# Patient Record
Sex: Male | Born: 1975 | Race: White | Hispanic: Yes | Marital: Married | Smoking: Never smoker
Health system: Southern US, Community
[De-identification: ages and names within clinical notes are randomized; demographics above are authoritative.]

## PROBLEM LIST (undated history)

## (undated) DIAGNOSIS — R011 Cardiac murmur, unspecified: Secondary | ICD-10-CM

## (undated) DIAGNOSIS — H269 Unspecified cataract: Secondary | ICD-10-CM

## (undated) DIAGNOSIS — T7840XA Allergy, unspecified, initial encounter: Secondary | ICD-10-CM

## (undated) DIAGNOSIS — C801 Malignant (primary) neoplasm, unspecified: Secondary | ICD-10-CM

## (undated) DIAGNOSIS — K648 Other hemorrhoids: Secondary | ICD-10-CM

## (undated) DIAGNOSIS — Z973 Presence of spectacles and contact lenses: Secondary | ICD-10-CM

## (undated) HISTORY — DX: Malignant (primary) neoplasm, unspecified: C80.1

## (undated) HISTORY — DX: Unspecified cataract: H26.9

## (undated) HISTORY — DX: Other hemorrhoids: K64.8

## (undated) HISTORY — DX: Allergy, unspecified, initial encounter: T78.40XA

## (undated) HISTORY — PX: EYE SURGERY: SHX253

## (undated) HISTORY — PX: MANDIBLE SURGERY: SHX707

---
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
## 2009-09-20 HISTORY — PX: MELANOMA EXCISION: SHX5266

## 2012-09-25 ENCOUNTER — Telehealth: Payer: Self-pay | Admitting: *Deleted

## 2012-09-25 DIAGNOSIS — C439 Malignant melanoma of skin, unspecified: Secondary | ICD-10-CM | POA: Insufficient documentation

## 2012-09-25 NOTE — Telephone Encounter (Signed)
error 

## 2015-12-01 ENCOUNTER — Other Ambulatory Visit: Payer: Self-pay | Admitting: Medical

## 2015-12-01 ENCOUNTER — Ambulatory Visit
Admission: RE | Admit: 2015-12-01 | Discharge: 2015-12-01 | Disposition: A | Discharge status: Home / Self Care | Payer: BLUE CROSS/BLUE SHIELD | Source: Ambulatory Visit | Attending: Medical | Admitting: Medical

## 2015-12-01 DIAGNOSIS — M79645 Pain in left finger(s): Secondary | ICD-10-CM

## 2016-12-26 IMAGING — CR DG FINGER INDEX 2+V*L*
1 series · 3 of 3 positions shown · non-contrast
Comparison: None.

CLINICAL DATA: Left index finger pain 2 months.  No injury.

EXAM:
LEFT INDEX FINGER 2+V

[Series 1: dg finger index left · 0.14mm/px · 3 of 3 slices shown]
[im 1/3]
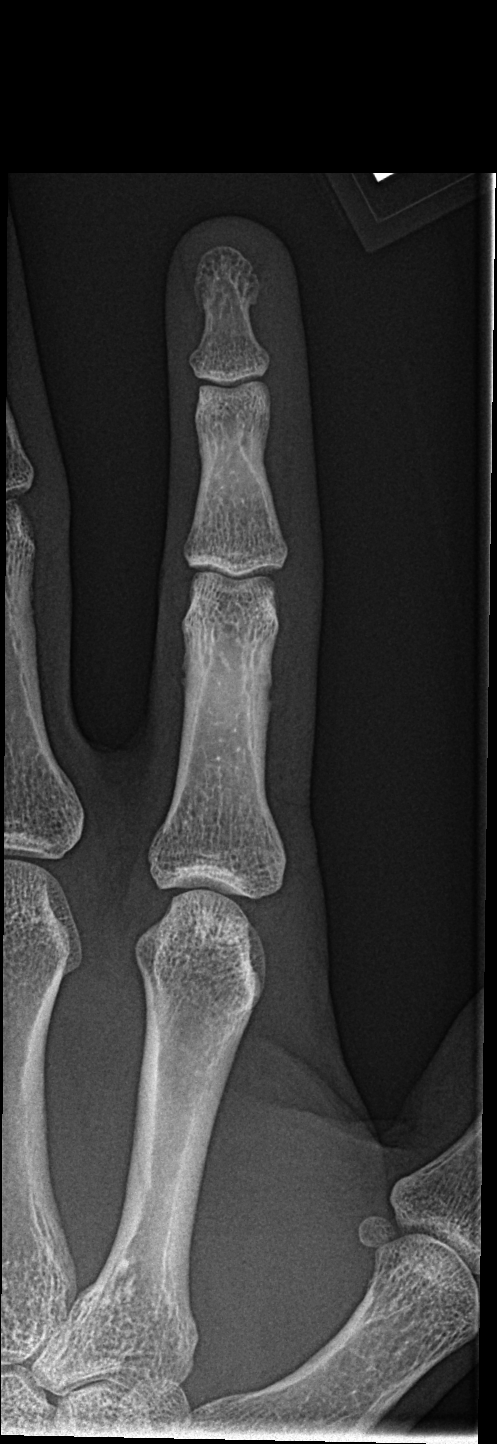
[im 2/3]
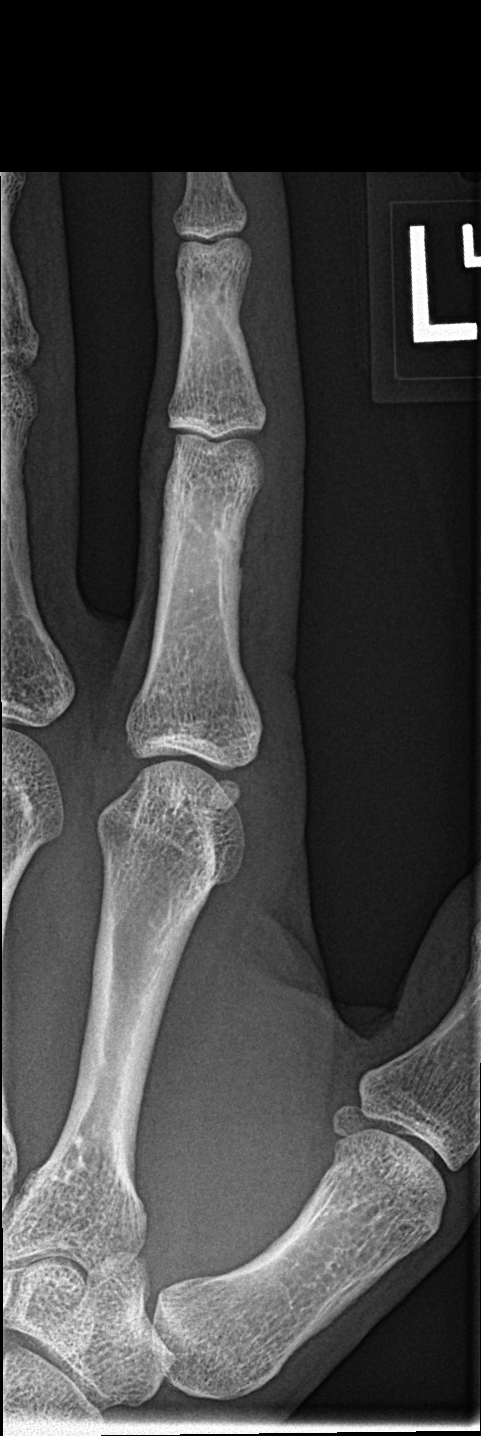
[im 3/3]
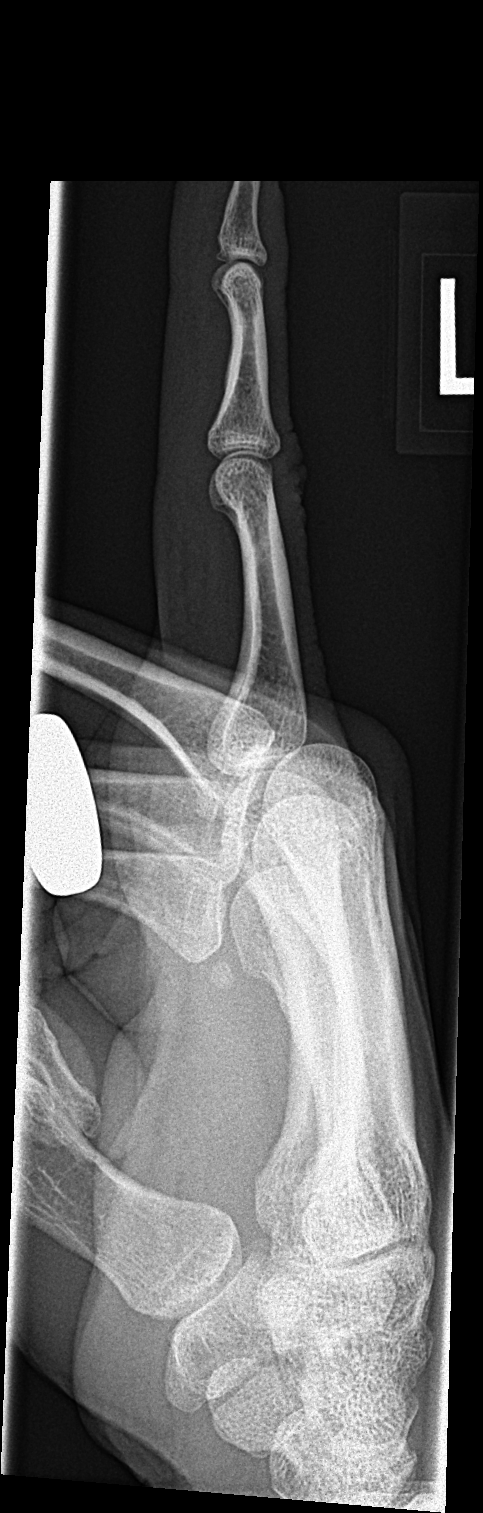

[3 of 3 positions shown; findings below may reference images not displayed]

FINDINGS: There is no evidence of fracture or dislocation. There is no
evidence of arthropathy or other focal bone abnormality. Soft
tissues are unremarkable.
IMPRESSION: Negative.

## 2017-03-14 ENCOUNTER — Ambulatory Visit: Payer: Self-pay | Admitting: Medical

## 2017-03-14 ENCOUNTER — Encounter: Payer: Self-pay | Admitting: Medical

## 2017-03-14 VITALS — BP 104/76 | HR 61 | Temp 97.1°F | Resp 16 | Ht 68.0 in | Wt 147.0 lb

## 2017-03-14 DIAGNOSIS — J029 Acute pharyngitis, unspecified: Secondary | ICD-10-CM

## 2017-03-14 MED ORDER — AMOXICILLIN-POT CLAVULANATE 875-125 MG PO TABS: 1.0000 | ORAL_TABLET | Freq: Two times a day (BID) | ORAL | 0 refills | Status: DC

## 2017-03-14 NOTE — Progress Notes (Signed)
   Subjective:    Patient ID: Randall Evans, male    DOB: 1976-08-07, 41 y.o.   MRN: [id number]  HPI Started last week with cough and sore throat , seemed to improve over the week and then sore throat and fever (100.3) returned yesterday. Took 2 Advil at 10 am today. Hurts to swallow, better with Advil.  75 yo and wife and mother-in-law.    Review of Systems  Constitutional: Positive for chills and fever. Negative for fatigue.  HENT: Positive for sore throat. Negative for congestion, ear pain and postnasal drip.   Eyes: Negative.   Respiratory: Negative for cough and chest tightness.   Cardiovascular: Negative.   Gastrointestinal: Negative for abdominal pain.  Genitourinary: Negative for dysuria.  Musculoskeletal: Negative for back pain.  Skin: Negative for rash.  Allergic/Immunologic: Negative for environmental allergies and food allergies.  Neurological: Positive for light-headedness. Negative for dizziness and syncope.  Hematological: Negative for adenopathy.  Psychiatric/Behavioral: Negative for confusion and hallucinations.       Objective:   Physical Exam  Constitutional: He is oriented to person, place, and time. He appears well-developed and well-nourished.  HENT:  Head: Normocephalic and atraumatic.  Right Ear: External ear normal.  Left Ear: External ear normal.  Mouth/Throat: Uvula is midline and mucous membranes are normal. Posterior oropharyngeal edema and posterior oropharyngeal erythema present.  Eyes: Conjunctivae and EOM are normal. Pupils are equal, round, and reactive to light.  Neck: Normal range of motion. Neck supple.  Cardiovascular: Normal rate, regular rhythm and normal heart sounds.  Exam reveals no gallop and no friction rub.   No murmur heard. Pulmonary/Chest: Effort normal and breath sounds normal.  Musculoskeletal: Normal range of motion.  Lymphadenopathy:    He has no cervical adenopathy.  Neurological: He is alert and oriented to person,  place, and time.  Skin: Skin is warm and dry.  Psychiatric: He has a normal mood and affect. His behavior is normal. Judgment and thought content normal.  Nursing note and vitals reviewed.         Assessment & Plan:  Pharyngitis e-prescribed Augmentin 875mg  -125mg  one by mouth twice daily for  Ten days  #20 no refills.  OTC Motrin or Tylenol as needed for pain , take as directed.  Soft foods. Return to the clinic in  3 -5 days if not imrproving.

## 2017-04-01 ENCOUNTER — Ambulatory Visit: Payer: Self-pay | Admitting: Adult Health

## 2017-04-01 VITALS — BP 110/88 | HR 62

## 2017-04-01 DIAGNOSIS — H60502 Unspecified acute noninfective otitis externa, left ear: Secondary | ICD-10-CM

## 2017-04-01 MED ORDER — AMOXICILLIN-POT CLAVULANATE 875-125 MG PO TABS: 1.0000 | ORAL_TABLET | Freq: Two times a day (BID) | ORAL | 0 refills | Status: DC

## 2017-04-01 NOTE — Progress Notes (Signed)
Subjective:     Patient ID: Randall Evans, male   DOB: 22-Oct-1975, 41 y.o.   MRN: [id number]  HPI  Patient is a 41 year old male who presents to clinic in no acute distress. He reports that the week of  July 4th week had bad sore throat, treated with Augmentin prescribed. He reports he took all antibiotics except for two days due to travel., he did not complete course.  He reports completely improving. He reports he was also exposed to his daughter with  Hand foot mouth daughter on the week of July 4th as well. Daughter  has now improved per his report. No other known contacts to ill exposures.   Started this Wednesday, fever 100 oral, sore throat, occasional headache - Advil improves. Sleep resolved headache.  Blood pressure 110/88, pulse 62, SpO2 99 %. Temp 97.7 oral  Review of Systems  Constitutional: Positive for chills and fever (100 oral x 2 days responds to Advil). Negative for appetite change (decreased appetite last pm), diaphoresis, fatigue and unexpected weight change.  HENT: Positive for sinus pressure (frontal), sore throat (mild) and voice change. Negative for congestion, dental problem, drooling, ear discharge, ear pain, facial swelling, hearing loss, mouth sores, nosebleeds, postnasal drip, rhinorrhea, sinus pain, sneezing, tinnitus and trouble swallowing.   Eyes: Negative.   Respiratory: Positive for wheezing. Negative for apnea, cough, choking, chest tightness, shortness of breath and stridor.   Cardiovascular: Negative for chest pain, palpitations and leg swelling.  Gastrointestinal: Positive for nausea (mild last pm). Negative for abdominal distention, abdominal pain, anal bleeding, blood in stool, constipation, diarrhea, rectal pain and vomiting.  Endocrine: Negative.   Genitourinary: Negative.   Musculoskeletal: Negative.   Skin: Negative.   Allergic/Immunologic: Negative for environmental allergies, food allergies and immunocompromised state.  Neurological: Positive  for headaches (mild occasional). Negative for dizziness, tremors, seizures, syncope, facial asymmetry, speech difficulty, weakness, light-headedness and numbness.  Hematological: Negative for adenopathy. Does not bruise/bleed easily.  Psychiatric/Behavioral: Negative for agitation, behavioral problems, confusion, decreased concentration, dysphoric mood, hallucinations, self-injury, sleep disturbance and suicidal ideas. The patient is not nervous/anxious and is not hyperactive.        Objective:   Physical Exam  Constitutional: He is oriented to person, place, and time. He appears well-developed and well-nourished. No distress.  HENT:  Head: Normocephalic and atraumatic.  Right Ear: Hearing, tympanic membrane, external ear and ear canal normal.  Left Ear: Hearing, external ear and ear canal normal. Tympanic membrane is erythematous. A middle ear effusion is present.  Nose: Rhinorrhea present. Right sinus exhibits no maxillary sinus tenderness and no frontal sinus tenderness. Left sinus exhibits no maxillary sinus tenderness and no frontal sinus tenderness.  Mouth/Throat: Uvula is midline and mucous membranes are normal. He does not have dentures. No oral lesions. No trismus in the jaw. Normal dentition. No dental abscesses, uvula swelling, lacerations or dental caries. Posterior oropharyngeal erythema present. No oropharyngeal exudate, posterior oropharyngeal edema or tonsillar abscesses.  Eyes: Pupils are equal, round, and reactive to light. Conjunctivae, EOM and lids are normal.  Neck: Trachea normal, normal range of motion, full passive range of motion without pain and phonation normal. Neck supple. Normal carotid pulses, no hepatojugular reflux and no JVD present. Carotid bruit is not present.  Cardiovascular: Normal rate, regular rhythm, S1 normal, S2 normal, normal heart sounds and normal pulses.  Exam reveals no distant heart sounds and no friction rub.   Pulmonary/Chest: Effort normal and  breath sounds normal.  Abdominal: Normal appearance.  Musculoskeletal: Normal range of motion.  Lymphadenopathy:       Head (right side): Submandibular adenopathy present. No submental, no tonsillar, no preauricular, no posterior auricular and no occipital adenopathy present.       Head (left side): Submandibular adenopathy present. No submental, no tonsillar, no preauricular, no posterior auricular and no occipital adenopathy present.    He has no cervical adenopathy.       Right cervical: No superficial cervical, no deep cervical and no posterior cervical adenopathy present.      Left cervical: No superficial cervical, no deep cervical and no posterior cervical adenopathy present.  Neurological: He is alert and oriented to person, place, and time. He has normal strength.  Skin: Skin is warm, dry and intact. He is not diaphoretic. No pallor.  Psychiatric: He has a normal mood and affect. His behavior is normal. Judgment and thought content normal. Cognition and memory are normal.       Assessment:     1. Left Otitis Media     Plan:     1. Will prescribe Augmentin for Left Otitis Media since previous course was not completed and patient was responding. Patient advised to call office if any symptoms change or worsen and go to urgent care or emergency room if symptoms worsen. Patient verbalized understanding.    2. Also discussed information regarding Hand Foot and Mouth and symptoms to watch for since he has had known exposure. Patient verbalized understanding/.

## 2017-04-01 NOTE — Patient Instructions (Addendum)
Education only  Since you were exposed to Hand, Foot, and Mouth Disease, Adult Hand, foot, and mouth disease is a common viral illness. It happens mainly in children who are younger than 39 years old, but adolescents and adults may also get it. The illness often causes: Sore throat. Sores in the mouth. Fever. Rash on the hands and feet.  Usually, this condition is not serious. Most people get better within 1-2 weeks. What are the causes? This condition is usually caused by a group of viruses called enteroviruses. The disease can spread from person to person (is contagious). A person is most contagious during the first week of the illness. The infection spreads through direct contact with: Nose discharge of an infected person. Throat discharge of an infected person. Stool (feces) of an infected person.  What are the signs or symptoms? Symptoms of this condition include: Small sores in the mouth. These may cause pain. A rash on the hands and feet, and sometimes on the buttocks. The rash may also occur on the arms, legs, or other areas of the body. The rash may look like small red bumps or sores and may have blisters. Fever. Body aches or headaches. Irritability. Decreased appetite.  How is this diagnosed? This condition can usually be diagnosed with a physical exam in which your health care provider will look at your rash and mouth sores. Tests are usually not needed. In some cases, a stool (feces) sample or a throat swab may be taken to check for the virus or for other infections. How is this treated? In most cases, no treatment is needed. People usually get better within 2 weeks without treatment. To help relieve pain or fever, your health care provider may recommend over-the-counter medicines such as ibuprofen or acetaminophen. To help relieve discomfort from mouth sores, your health care provider may recommend using: Solutions that are rinsed in the mouth. Pain-relieving gel that is  applied to the sores (topical gel). Antacid medicine.  Follow these instructions at home: Managing pain and discomfort Rinse your mouth with a salt-water mixture 3-4 times a day or as needed. To make a salt-water mixture, completely dissolve -1 tsp of salt in 1 cup of warm water. This can help to reduce pain from the mouth sores. Your health care provider may also recommend other rinse solutions to treat mouth sores. To relieve discomfort when you are eating: Try combinations of foods to see what you can tolerate. Aim for a balanced diet. Eat soft foods. These may be easier to swallow. Avoid foods and drinks that are salty, spicy, or acidic. Avoid alcohol. Try cold food and drinks, such as water, milk, milkshakes, frozen ice pops, slushies, and sherbets. Low-calorie sport drinks are good choices for staying hydrated. General instructions Return to your normal activities as told by your health care provider. Ask your health care provider what activities are safe for you. Take or apply over-the-counter and prescription medicines only as told by your health care provider. Wash your hands often with soap and water. If soap and water are not available, use hand sanitizer. Stay away from work, schools, or other group settings during the first few days of the illness, or until your fever is gone. Keep all follow-up visits as told by your health care provider. This is important. Contact a health care provider if: Your symptoms get worse or do not improve within 2 weeks. You have pain that does not get better with medicine. You feel very irritable. You have trouble  swallowing. You develop sores or blisters on your lips or outside of your mouth. You have a fever for more than 3 days. Get help right away if: You develop signs of severe dehydration, such as: Decreased urination. This means urinating only very small amounts or urinating fewer than 3 times in a 24-hour period. Urine that is very  dark. Dry mouth, tongue, or lips. Decreased tears or sunken eyes. Dry skin. Rapid breathing. Decreased activity or being very sleepy. Pale skin. Fingertips taking longer than 2 seconds to turn pink after a gentle squeeze. Weight loss. You have a severe headache. You have a stiff neck. You experience changes in your behavior. You have chest pain. You have trouble breathing. Summary Hand, foot, and mouth disease is a common viral illness. This disease can spread from person to person (is contagious). The illness often causes a sore throat, sores in the mouth, fever, and a rash on the hands and feet. Typically, no treatment is needed for this condition. People usually get better within 2 weeks without treatment. Get help right away if you develop signs of severe dehydration. This information is not intended to replace advice given to you by your health care provider. Make sure you discuss any questions you have with your health care provider. Document Released: 10/25/2016 Document Revised: 10/25/2016 Document Reviewed: 10/25/2016 Elsevier Interactive Patient Education  2018 [employer]. Otitis Media, Adult Otitis media is redness, soreness, and puffiness (swelling) in the space just behind your eardrum (middle ear). It may be caused by allergies or infection. It often happens along with a cold. Follow these instructions at home:  Take your medicine as told. Finish it even if you start to feel better.  Only take over-the-counter or prescription medicines for pain, discomfort, or fever as told by your doctor.  Follow up with your doctor as told. Contact a doctor if:  You have otitis media only in one ear, or bleeding from your nose, or both.  You notice a lump on your neck.  You are not getting better in 3-5 days.  You feel worse instead of better. Get help right away if:  You have pain that is not helped with medicine.  You have puffiness, redness, or pain around your  ear.  You get a stiff neck.  You cannot move part of your face (paralysis).  You notice that the bone behind your ear hurts when you touch it. This information is not intended to replace advice given to you by your health care provider. Make sure you discuss any questions you have with your health care provider. Document Released: 02/23/2008 Document Revised: 02/12/2016 Document Reviewed: 04/03/2013 Elsevier Interactive Patient Education  2017 [employer].

## 2017-05-16 ENCOUNTER — Ambulatory Visit: Payer: Self-pay | Admitting: Medical

## 2017-05-16 DIAGNOSIS — M778 Other enthesopathies, not elsewhere classified: Secondary | ICD-10-CM

## 2017-05-16 NOTE — Patient Instructions (Signed)
OTC Ibuprofen 800mg   Three times a day with food.  Ice on /off  5-6min.    Tendinitis Tendinitis is inflammation of a tendon. A tendon is a strong cord of tissue that connects muscle to bone. Tendinitis can affect any tendon, but it most commonly affects the shoulder tendon (rotator cuff), ankle tendon (Achilles tendon), elbow tendon (triceps tendon), or one of the tendons in the wrist. What are the causes? This condition may be caused by:  Overusing a tendon or muscle. This is common.  Age-related wear and tear.  Injury.  Inflammatory conditions, such as arthritis.  Certain medicines.  What increases the risk? This condition is more likely to develop in people who do activities that involve repetitive motions. What are the signs or symptoms? Symptoms of this condition may include:  Pain.  Tenderness.  Mild swelling.  How is this diagnosed? This condition is diagnosed with a physical exam. You may also have tests, such as:  Ultrasound. This uses sound waves to make an image of your affected area.  MRI.  How is this treated? This condition may be treated by resting, icing, applying pressure (compression), and raising (elevating) the area above the level of your heart. This is known as RICE therapy. Treatment may also include:  Medicines to help reduce inflammation or to help reduce pain.  Exercises or physical therapy to strengthen and stretch the tendon.  A brace or splint.  Surgery (rare).  Follow these instructions at home:  If you have a splint or brace:  Wear the splint or brace as told by your health care provider. Remove it only as told by your health care provider.  Loosen the splint or brace if your fingers or toes tingle, become numb, or turn cold and blue.  Do not take baths, swim, or use a hot tub until your health care provider approves. Ask your health care provider if you can take showers. You may only be allowed to take sponge baths for  bathing.  Do not let your splint or brace get wet if it is not waterproof. ? If your splint or brace is not waterproof, cover it with a watertight plastic bag when you take a bath or a shower.  Keep the splint or brace clean. Managing pain, stiffness, and swelling  If directed, apply ice to the affected area. ? Put ice in a plastic bag. ? Place a towel between your skin and the bag. ? Leave the ice on for 20 minutes, 2-3 times a day.  If directed, apply heat to the affected area as often as told by your health care provider. Use the heat source that your health care provider recommends, such as a moist heat pack or a heating pad. ? Place a towel between your skin and the heat source. ? Leave the heat on for 20-30 minutes. ? Remove the heat if your skin turns bright red. This is especially important if you are unable to feel pain, heat, or cold. You may have a greater risk of getting burned.  Move the fingers or toes of the affected limb often, if this applies. This can help to prevent stiffness and lessen swelling.  If directed, elevate the affected area above the level of your heart while you are sitting or lying down. Driving  Do not drive or operate heavy machinery while taking prescription pain medicine.  Ask your health care provider when it is safe to drive if you have a splint or brace on any  part of your arm or leg. Activity  Return to your normal activities as told by your health care provider. Ask your health care provider what activities are safe for you.  Rest the affected area as told by your health care provider.  Avoid using the affected area while you are experiencing symptoms of tendinitis.  Do exercises as told by your health care provider. General instructions  If you have a splint, do not put pressure on any part of the splint until it is fully hardened. This may take several hours.  Wear an elastic bandage or compression wrap only as told by your health  care provider.  Take over-the-counter and prescription medicines only as told by your health care provider.  Keep all follow-up visits as told by your health care provider. This is important. Contact a health care provider if:  Your symptoms do not improve.  You develop new, unexplained problems, such as numbness in your hands. This information is not intended to replace advice given to you by your health care provider. Make sure you discuss any questions you have with your health care provider. Document Released: 09/03/2000 Document Revised: 05/06/2016 Document Reviewed: 06/09/2015 Elsevier Interactive Patient Education  [employer].

## 2017-05-16 NOTE — Progress Notes (Signed)
   Subjective:    Patient ID: Randall Evans, male    DOB: 1975-09-28, 41 y.o.   MRN: [id number]  HPI  41 yo male with left wrist pain x  2 weeks, Left-handed. Uses the mouse with his right-hand. No numbness or tingling. Not doing any treatment. Extension of the hand causes pain, mild on supination. Localized on the ulna side on anterior side. No known injury or trauma.   Review of Systems  Constitutional: Negative for chills and fever.  HENT: Negative for congestion, ear pain, sinus pain, sinus pressure and sore throat.   Eyes: Negative for discharge and itching.  Respiratory: Negative for cough and shortness of breath.   Cardiovascular: Negative for chest pain and leg swelling.  Gastrointestinal: Negative for abdominal pain.  Endocrine: Negative for cold intolerance and heat intolerance.  Genitourinary: Negative for dysuria.  Musculoskeletal: Negative for myalgias.  Allergic/Immunologic: Negative for environmental allergies and food allergies.  Neurological: Negative for dizziness, syncope and headaches.  Hematological: Negative for adenopathy.  Psychiatric/Behavioral: Negative for agitation, behavioral problems, confusion and hallucinations.       Objective:   Physical Exam  Constitutional: He is oriented to person, place, and time. He appears well-developed and well-nourished.  HENT:  Head: Normocephalic and atraumatic.  Eyes: Pupils are equal, round, and reactive to light. Conjunctivae and EOM are normal.  Neck: Normal range of motion.  Musculoskeletal: Normal range of motion. He exhibits tenderness. He exhibits no deformity.  Neurological: He is oriented to person, place, and time.  Skin: Skin is warm and dry.  Psychiatric: He has a normal mood and affect. His behavior is normal. Judgment and thought content normal.    No swelling or redness  noted FROM, discomfort on extension. 2+ radial pulse. No bony tenderness. Negative tinels and phalens test. No elbow pain on  palpation, FROM.      Assessment & Plan:  Left wrist tendonitis ulnar side. Splinted wrist. OTC Ibuprofen 800mg  three times a day with food . Follow up in one week for recheck. Next step would be an x-ray and /or ortho referral. Patient verbalizes understanding and has no questions at discharge.

## 2017-05-24 ENCOUNTER — Encounter: Payer: Self-pay | Admitting: Medical

## 2017-05-24 ENCOUNTER — Ambulatory Visit: Payer: Self-pay | Admitting: Medical

## 2017-05-24 VITALS — BP 106/60 | HR 58 | Temp 96.6°F | Resp 18 | Ht 68.0 in | Wt 146.0 lb

## 2017-05-24 DIAGNOSIS — M778 Other enthesopathies, not elsewhere classified: Secondary | ICD-10-CM

## 2017-05-24 NOTE — Progress Notes (Signed)
   Subjective:    Patient ID: Randall Evans, male    DOB: 01-09-1976, 41 y.o.   MRN: [id number]  HPI   41 yo male Returns for follow up on left wrist tendonitis, better with wrist splint and Ibpruprofen ( did also ice it a couple of times), tried yesterday to use wrist without splint cleaning a cast iron pan and pain resumed 4/10, similar to last visit. No numbess or tingling.  No trauma or injury he recalls..Pain on flexion and ulnar deviation.Some relief with wearing splint. Traveling vacation [place]. Has a 43 yo daughter he needs to pick up .  Takes Elderberry gummies ( immune booster).   Review of Systems  Constitutional: Negative for chills and fever.  HENT: Negative for congestion, ear pain and sore throat.   Eyes: Negative for discharge and itching.  Respiratory: Negative for cough.   Cardiovascular: Negative for chest pain.  Gastrointestinal: Negative for abdominal pain.  Endocrine: Negative for polydipsia, polyphagia and polyuria.  Genitourinary: Negative for dysuria.  Musculoskeletal: Negative for back pain and myalgias.  Skin: Negative for rash.  Allergic/Immunologic: Negative for environmental allergies and food allergies.  Neurological: Negative for dizziness, speech difficulty and light-headedness.  Hematological: Negative for adenopathy.  Psychiatric/Behavioral: Negative for behavioral problems, self-injury and suicidal ideas. The patient is not nervous/anxious.          Objective:   Physical Exam  Constitutional: He is oriented to person, place, and time. He appears well-developed and well-nourished.  HENT:  Head: Normocephalic and atraumatic.  Eyes: Pupils are equal, round, and reactive to light. Conjunctivae and EOM are normal.  Neck: Normal range of motion.  Musculoskeletal: Normal range of motion. He exhibits edema and tenderness. He exhibits no deformity.  Neurological: He is alert and oriented to person, place, and time.  Skin: Skin is warm and dry.   Psychiatric: He has a normal mood and affect. His behavior is normal. Judgment and thought content normal.  Nursing note and vitals reviewed.    Pain on flexion and ulnar deviation left wrist.. 5/5 grip strength equal  bilateral, FROM  2+ radial pulse, tender to palpation on the ulnar side of wrist, mild swelling noted on the ulnar side of wrist.     Assessment & Plan:  Tendonitis, will refer to orthopedics per patient request for evaluation and treatment. We discussed oral steroids versus a possible injection.   He is traveling on vacation for the next week. Return to the clinic as needed.

## 2017-05-24 NOTE — Patient Instructions (Signed)
Tendinitis Tendinitis is inflammation of a tendon. A tendon is a strong cord of tissue that connects muscle to bone. Tendinitis can affect any tendon, but it most commonly affects the shoulder tendon (rotator cuff), ankle tendon (Achilles tendon), elbow tendon (triceps tendon), or one of the tendons in the wrist. What are the causes? This condition may be caused by:  Overusing a tendon or muscle. This is common.  Age-related wear and tear.  Injury.  Inflammatory conditions, such as arthritis.  Certain medicines.  What increases the risk? This condition is more likely to develop in people who do activities that involve repetitive motions. What are the signs or symptoms? Symptoms of this condition may include:  Pain.  Tenderness.  Mild swelling.  How is this diagnosed? This condition is diagnosed with a physical exam. You may also have tests, such as:  Ultrasound. This uses sound waves to make an image of your affected area.  MRI.  How is this treated? This condition may be treated by resting, icing, applying pressure (compression), and raising (elevating) the area above the level of your heart. This is known as RICE therapy. Treatment may also include:  Medicines to help reduce inflammation or to help reduce pain.  Exercises or physical therapy to strengthen and stretch the tendon.  A brace or splint.  Surgery (rare).  Follow these instructions at home:  If you have a splint or brace:  Wear the splint or brace as told by your health care provider. Remove it only as told by your health care provider.  Loosen the splint or brace if your fingers or toes tingle, become numb, or turn cold and blue.  Do not take baths, swim, or use a hot tub until your health care provider approves. Ask your health care provider if you can take showers. You may only be allowed to take sponge baths for bathing.  Do not let your splint or brace get wet if it is not waterproof. ? If your  splint or brace is not waterproof, cover it with a watertight plastic bag when you take a bath or a shower.  Keep the splint or brace clean. Managing pain, stiffness, and swelling  If directed, apply ice to the affected area. ? Put ice in a plastic bag. ? Place a towel between your skin and the bag. ? Leave the ice on for 20 minutes, 2-3 times a day.  If directed, apply heat to the affected area as often as told by your health care provider. Use the heat source that your health care provider recommends, such as a moist heat pack or a heating pad. ? Place a towel between your skin and the heat source. ? Leave the heat on for 20-30 minutes. ? Remove the heat if your skin turns bright red. This is especially important if you are unable to feel pain, heat, or cold. You may have a greater risk of getting burned.  Move the fingers or toes of the affected limb often, if this applies. This can help to prevent stiffness and lessen swelling.  If directed, elevate the affected area above the level of your heart while you are sitting or lying down. Driving  Do not drive or operate heavy machinery while taking prescription pain medicine.  Ask your health care provider when it is safe to drive if you have a splint or brace on any part of your arm or leg. Activity  Return to your normal activities as told by your health care   provider. Ask your health care provider what activities are safe for you.  Rest the affected area as told by your health care provider.  Avoid using the affected area while you are experiencing symptoms of tendinitis.  Do exercises as told by your health care provider. General instructions  If you have a splint, do not put pressure on any part of the splint until it is fully hardened. This may take several hours.  Wear an elastic bandage or compression wrap only as told by your health care provider.  Take over-the-counter and prescription medicines only as told by your  health care provider.  Keep all follow-up visits as told by your health care provider. This is important. Contact a health care provider if:  Your symptoms do not improve.  You develop new, unexplained problems, such as numbness in your hands. This information is not intended to replace advice given to you by your health care provider. Make sure you discuss any questions you have with your health care provider. Document Released: 09/03/2000 Document Revised: 05/06/2016 Document Reviewed: 06/09/2015 [employer] Interactive Patient Education  2018 [employer]  

## 2017-11-15 ENCOUNTER — Encounter: Payer: Self-pay | Admitting: Medical

## 2017-11-15 ENCOUNTER — Ambulatory Visit: Payer: Self-pay | Admitting: Registered Nurse

## 2017-11-15 VITALS — BP 112/70 | HR 67 | Temp 98.6°F | Resp 16 | Wt 153.0 lb

## 2017-11-15 DIAGNOSIS — J019 Acute sinusitis, unspecified: Secondary | ICD-10-CM

## 2017-11-15 DIAGNOSIS — H65191 Other acute nonsuppurative otitis media, right ear: Secondary | ICD-10-CM

## 2017-11-15 MED ORDER — SALINE SPRAY 0.65 % NA SOLN: 2.0000 | NASAL | 0 refills | Status: DC

## 2017-11-15 MED ORDER — FLUTICASONE PROPIONATE 50 MCG/ACT NA SUSP: 2.0000 | Freq: Every day | NASAL | 1 refills | Status: DC

## 2017-11-15 MED ORDER — AMOXICILLIN-POT CLAVULANATE 875-125 MG PO TABS
1.0000 | ORAL_TABLET | Freq: Two times a day (BID) | ORAL | 0 refills | Status: DC
Start: 2017-11-15 — End: 2017-12-19

## 2017-11-15 MED ORDER — ACETAMINOPHEN 500 MG PO TABS: 1000.0000 mg | ORAL_TABLET | Freq: Four times a day (QID) | ORAL | 0 refills | Status: AC | PRN

## 2017-11-15 MED ORDER — IBUPROFEN 800 MG PO TABS: 800.0000 mg | ORAL_TABLET | Freq: Three times a day (TID) | ORAL | 0 refills | Status: DC

## 2017-11-15 NOTE — Progress Notes (Signed)
Subjective:    Patient ID: Randall Evans, male    DOB: 24-Aug-1976, 42 y.o.   MRN: [id number]  41y/o married caucasian male established patient here for evaluation sore throat, headache, congestion and fatigue.  Spouse and daughter sick with URI symptoms.  Hasn't tried any meds  Denied seasonal allergies usually just takes tylenol cold and sinus prn.  Has used augmentin in the past for URI/ear infections with good results.  Did not have flu shot this year.  Got run down with sick child waking him up in middle of night was sick all last week and spouse started ill this weekend      Review of Systems  Constitutional: Positive for fatigue. Negative for activity change, appetite change, chills, diaphoresis, fever and unexpected weight change.  HENT: Positive for congestion, postnasal drip, rhinorrhea, sinus pressure, sinus pain and sore throat. Negative for dental problem, drooling, ear discharge, ear pain, facial swelling, hearing loss, mouth sores, nosebleeds, sneezing, tinnitus, trouble swallowing and voice change.   Eyes: Negative for photophobia, pain, discharge, redness, itching and visual disturbance.  Respiratory: Positive for cough. Negative for choking, chest tightness, shortness of breath, wheezing and stridor.   Cardiovascular: Negative for chest pain, palpitations and leg swelling.  Gastrointestinal: Negative for abdominal distention, abdominal pain, blood in stool, constipation, nausea and vomiting.  Endocrine: Negative for cold intolerance and heat intolerance.  Genitourinary: Negative for dysuria.  Musculoskeletal: Negative for arthralgias, back pain, gait problem, joint swelling, myalgias, neck pain and neck stiffness.  Skin: Negative for color change, pallor, rash and wound.  Allergic/Immunologic: Negative for environmental allergies, food allergies and immunocompromised state.  Neurological: Negative for dizziness, tremors, seizures, syncope, facial asymmetry, speech  difficulty, weakness, light-headedness, numbness and headaches.  Hematological: Negative for adenopathy. Does not bruise/bleed easily.  Psychiatric/Behavioral: Positive for sleep disturbance. Negative for agitation and confusion.       Objective:   Physical Exam  Constitutional: He is oriented to person, place, and time. Vital signs are normal. He appears well-developed and well-nourished. He is active and cooperative.  Non-toxic appearance. He does not have a sickly appearance. He appears ill. No distress.  HENT:  Head: Normocephalic and atraumatic.  Right Ear: Hearing, external ear and ear canal normal. Tympanic membrane is erythematous and bulging. A middle ear effusion is present.  Left Ear: Hearing, external ear and ear canal normal. A middle ear effusion is present.  Nose: Mucosal edema and rhinorrhea present. No nose lacerations, sinus tenderness, nasal deformity, septal deviation or nasal septal hematoma. No epistaxis.  No foreign bodies. Right sinus exhibits maxillary sinus tenderness. Right sinus exhibits no frontal sinus tenderness. Left sinus exhibits maxillary sinus tenderness. Left sinus exhibits no frontal sinus tenderness.  Mouth/Throat: Uvula is midline and mucous membranes are normal. Mucous membranes are not pale, not dry and not cyanotic. He does not have dentures. No oral lesions. No trismus in the jaw. Normal dentition. No dental abscesses, uvula swelling, lacerations or dental caries. Posterior oropharyngeal edema and posterior oropharyngeal erythema present. No oropharyngeal exudate or tonsillar abscesses.  Maxillary sinuses pressure to palpation bilaterally; cobblestoning posterior pharynx; bilateral TMs air fluid level clear; right TM with peripheral diameter erythema and bulging; bilateral allergic shiners; bilateral nasal turbinates edema/erythema/clear discharge; tonsills cryptic 2+/4 bilaterally edema/erythema no exudate  Eyes: Conjunctivae, EOM and lids are normal.  Pupils are equal, round, and reactive to light. Right eye exhibits no chemosis, no discharge, no exudate and no hordeolum. No foreign body present in the right eye. Left  eye exhibits no chemosis, no discharge, no exudate and no hordeolum. No foreign body present in the left eye. Right conjunctiva is not injected. Right conjunctiva has no hemorrhage. Left conjunctiva is not injected. Left conjunctiva has no hemorrhage. No scleral icterus. Right eye exhibits normal extraocular motion and no nystagmus. Left eye exhibits normal extraocular motion and no nystagmus. Right pupil is round and reactive. Left pupil is round and reactive. Pupils are equal.  Neck: Trachea normal, normal range of motion and phonation normal. Neck supple. No tracheal tenderness and no muscular tenderness present. No neck rigidity. No tracheal deviation, no edema, no erythema and normal range of motion present. No thyroid mass and no thyromegaly present.  Cardiovascular: Normal rate, regular rhythm, S1 normal, S2 normal, normal heart sounds and intact distal pulses. PMI is not displaced. Exam reveals no gallop and no friction rub.  No murmur heard. Pulmonary/Chest: Effort normal and breath sounds normal. No accessory muscle usage or stridor. No respiratory distress. He has no decreased breath sounds. He has no wheezes. He has no rhonchi. He has no rales.  No cough observed in exam room; spoke full sentences without difficulty  Abdominal: Soft. Normal appearance. He exhibits no distension, no fluid wave and no ascites. There is no rigidity and no guarding.  Musculoskeletal: Normal range of motion. He exhibits no edema or tenderness.       Right shoulder: Normal.       Left shoulder: Normal.       Right elbow: Normal.      Left elbow: Normal.       Right wrist: Normal.       Left wrist: Normal.       Right hip: Normal.       Left hip: Normal.       Right knee: Normal.       Left knee: Normal.       Cervical back: Normal.        Thoracic back: Normal.       Lumbar back: Normal.       Right hand: Normal.       Left hand: Normal.  Lymphadenopathy:       Head (right side): No submental, no submandibular, no tonsillar, no preauricular, no posterior auricular and no occipital adenopathy present.       Head (left side): No submental, no submandibular, no tonsillar, no preauricular, no posterior auricular and no occipital adenopathy present.    He has no cervical adenopathy.       Right cervical: No superficial cervical, no deep cervical and no posterior cervical adenopathy present.      Left cervical: No superficial cervical, no deep cervical and no posterior cervical adenopathy present.  Neurological: He is alert and oriented to person, place, and time. He has normal strength. He displays no atrophy and no tremor. No cranial nerve deficit or sensory deficit. He exhibits normal muscle tone. He displays no seizure activity. Coordination and gait normal. GCS eye subscore is 4. GCS verbal subscore is 5. GCS motor subscore is 6.  On/off exam table without difficulty; gait sure and steady in hallway  Skin: Skin is warm, dry and intact. No abrasion, no bruising, no burn, no ecchymosis, no laceration, no lesion, no petechiae and no rash noted. He is not diaphoretic. No cyanosis or erythema. No pallor. Nails show no clubbing.  Psychiatric: He has a normal mood and affect. His speech is normal and behavior is normal. Judgment and thought content normal. Cognition  and memory are normal.  Nursing note and vitals reviewed.         Assessment & Plan:  A-acute rhinosinusitis and acute right otitis media nonsupportive  P- Discussed post nasal drip probable cause sore throat and enlarged tonsils.  Electronic Rx augmentin 875mg  po BID x 10 days #20 RF0 to patient pharmacy of choice.  Tylenol 1000mg  po QID prn pain or motrin 800mg  po TID prn pain take with food.   No evidence of invasive bacterial infection, non toxic and well hydrated.   This is most likely self limiting viral infection.  I do not see where any further testing or imaging is necessary at this time.   I will suggest supportive care, rest, good hygiene and encourage the patient to take adequate fluids.  The patient is to return to clinic or EMERGENCY ROOM if symptoms worsen or change significantly e.g. ear pain, fever, purulent discharge from ears or bleeding.  Exitcare handout on otitis media printed and given to patient.  Patient verbalized agreement and understanding of treatment plan.     tylenol 1000mg  po QID prn pain/fever.  Restart OTC antihistamine of choice e.g. Claritin/zyrtec 10mg  po daily. Change toothbrush on last day of antibiotic. Refused work note.   Usually no specific medical treatment is needed if a virus is causing the sore throat.  The throat most often gets better on its own within 5 to 7 days.  Antibiotic medicine does not cure viral pharyngitis.  Post nasal drip can cause sore throat. For acute pharyngitis caused by bacteria, your healthcare provider will prescribe an antibiotic.  Marland Kitchen Do not smoke.  Marland Kitchen Avoid secondhand smoke and other air pollutants.  . Use a cool mist humidifier to add moisture to the air.  . Get plenty of rest.  . You may want to rest your throat by talking less and eating a diet that is mostly liquid or soft for a day or two.  Hydrate to keep urine clear pale yellow . Nonprescription throat lozenges and mouthwashes should help relieve the soreness.   . Gargling with warm saltwater and drinking warm liquids may help.  (You can make a saltwater solution by adding 1/4 teaspoon of salt to 8 ounces, or 240 mL, of warm water.)  . A nonprescription pain reliever such as tylenol/acetaminophen may ease general aches and pains.   FOLLOW UP with clinic provider if no improvements in the next 7-10 days.   Patient verbalized understanding of instructions and agreed with plan of care.  Patient had no further questions at this time. P2:  Hand  washing and diet.   start flonase 1 spray each nostril BID #1 RF1, saline 2 sprays each nostril q2h wa prn congestion OTC.  If no improvement with 48 hours of saline and flonase use start augmentin 875mg  po BID x 10 days #20 RF0.  Electronic Rx given.  Denied personal or family history of ENT cancer.  Shower BID especially prior to bed. No evidence of systemic bacterial infection, non toxic and well hydrated.  I do not see where any further testing or imaging is necessary at this time.   I will suggest supportive care, rest, good hygiene and encourage the patient to take adequate fluids.  The patient is to return to clinic or EMERGENCY ROOM if symptoms worsen or change significantly.  Exitcare handout on sinusitis and sinus rinse given to patient.  Patient verbalized agreement and understanding of treatment plan and had no further questions at this time.  P2:  Hand washing and cover cough  Discussed spring allergy season in full swing here along with adenovirus/flu.  Patient denied fever/body aches/stomach upset.  Patient may use normal saline nasal spray 2 sprays each nostril q2h wa as needed. flonase 40mcg 1 spray each nostril BID #1 RF0.  Patient denied personal or family history of ENT cancer.  OTC antihistamine of choice claritin/zyrtec 10mg  po daily or tylenol cold and sinus.  Discussed to look at active ingredients for how much tylenol in the cough and cold tablet not to exceed 1000mg  po QID.  Avoid triggers if possible.  Shower prior to bedtime if exposed to triggers.  If allergic dust/dust mites recommend mattress/pillow covers/encasements; washing linens, vacuuming, sweeping, dusting weekly.  Call or return to clinic as needed if these symptoms worsen or fail to improve as anticipated.   Exitcare handout on viral illness given to patient.  Patient verbalized understanding of instructions, agreed with plan of care and had no further questions at this time.  P2:  Avoidance and hand washing.

## 2017-11-15 NOTE — Patient Instructions (Signed)
Otitis Media, Adult Otitis media occurs when there is inflammation and fluid in the middle ear. Your middle ear is a part of the ear that contains bones for hearing as well as air that helps send sounds to your brain. What are the causes? This condition is caused by a blockage in the eustachian tube. This tube drains fluid from the ear to the back of the nose (nasopharynx). A blockage in this tube can be caused by an object or by swelling (edema) in the tube. Problems that can cause a blockage include:  A cold or other upper respiratory infection.  Allergies.  An irritant, such as tobacco smoke.  Enlarged adenoids. The adenoids are areas of soft tissue located high in the back of the throat, behind the nose and the roof of the mouth.  A mass in the nasopharynx.  Damage to the ear caused by pressure changes (barotrauma). What are the signs or symptoms? Symptoms of this condition include:  Ear pain.  A fever.  Decreased hearing.  A headache.  Tiredness (lethargy).  Fluid leaking from the ear.  Ringing in the ear. How is this diagnosed? This condition is diagnosed with a physical exam. During the exam your health care provider will use an instrument called an otoscope to look into your ear and check for redness, swelling, and fluid. He or she will also ask about your symptoms. Your health care provider may also order tests, such as:  A test to check the movement of the eardrum (pneumatic otoscopy). This test is done by squeezing a small amount of air into the ear.  A test that changes air pressure in the middle ear to check how well the eardrum moves and whether the eustachian tube is working (tympanogram). How is this treated? This condition usually goes away on its own within 3-5 days. But if the condition is caused by a bacteria infection and does not go away own its own, or keeps coming back, your health care provider may:  Prescribe antibiotic medicines to treat the  infection.  Prescribe or recommend medicines to control pain. Follow these instructions at home:  Take over-the-counter and prescription medicines only as told by your health care provider.  If you were prescribed an antibiotic medicine, take it as told by your health care provider. Do not stop taking the antibiotic even if you start to feel better.  Keep all follow-up visits as told by your health care provider. This is important. Contact a health care provider if:  You have bleeding from your nose.  There is a lump on your neck.  You are not getting better in 5 days.  You feel worse instead of better. Get help right away if:  You have severe pain that is not controlled with medicine.  You have swelling, redness, or pain around your ear.  You have stiffness in your neck.  A part of your face is paralyzed.  The bone behind your ear (mastoid) is tender when you touch it.  You develop a severe headache. Summary  Otitis media is redness, soreness, and swelling of the middle ear.  This condition usually goes away on its own within 3-5 days.  If the problem does not go away in 3-5 days, your health care provider may prescribe or recommend medicines to treat your symptoms.  If you were prescribed an antibiotic medicine, take it as told by your health care provider. This information is not intended to replace advice given to you by your   to you by your health care provider. Make sure you discuss any questions you have with your health care provider. Document Released: 06/11/2004 Document Revised: 08/27/2016 Document Reviewed: 08/27/2016 Elsevier Interactive Patient Education  2018 [employer]. Pharyngitis Pharyngitis is redness, pain, and swelling (inflammation) of the throat (pharynx). It is a very common cause of sore throat. Pharyngitis can be caused by a bacteria, but it is usually caused by a virus. Most cases of pharyngitis get better on their own without treatment. What are the  causes? This condition may be caused by:  Infection by viruses (viral). Viral pharyngitis spreads from person to person (is contagious) through coughing, sneezing, and sharing of personal items or utensils such as cups, forks, spoons, and toothbrushes.  Infection by bacteria (bacterial). Bacterial pharyngitis may be spread by touching the nose or face after coming in contact with the bacteria, or through more intimate contact, such as kissing.  Allergies. Allergies can cause buildup of mucus in the throat (post-nasal drip), leading to inflammation and irritation. Allergies can also cause blocked nasal passages, forcing breathing through the mouth, which dries and irritates the throat.  What increases the risk? You are more likely to develop this condition if:  You are 61-70 years old.  You are exposed to crowded environments such as daycare, school, or dormitory living.  You live in a cold climate.  You have a weakened disease-fighting (immune) system.  What are the signs or symptoms? Symptoms of this condition vary by the cause (viral, bacterial, or allergies) and can include:  Sore throat.  Fatigue.  Low-grade fever.  Headache.  Joint pain and muscle aches.  Skin rashes.  Swollen glands in the throat (lymph nodes).  Plaque-like film on the throat or tonsils. This is often a symptom of bacterial pharyngitis.  Vomiting.  Stuffy nose (nasal congestion).  Cough.  Red, itchy eyes (conjunctivitis).  Loss of appetite.  How is this diagnosed? This condition is often diagnosed based on your medical history and a physical exam. Your health care provider will ask you questions about your illness and your symptoms. A swab of your throat may be done to check for bacteria (rapid strep test). Other lab tests may also be done, depending on the suspected cause, but these are rare. How is this treated? This condition usually gets better in 3-4 days without medicine. Bacterial  pharyngitis may be treated with antibiotic medicines. Follow these instructions at home:  Take over-the-counter and prescription medicines only as told by your health care provider. ? If you were prescribed an antibiotic medicine, take it as told by your health care provider. Do not stop taking the antibiotic even if you start to feel better. ? Do not give children aspirin because of the association with Reye syndrome.  Drink enough water and fluids to keep your urine clear or pale yellow.  Get a lot of rest.  Gargle with a salt-water mixture 3-4 times a day or as needed. To make a salt-water mixture, completely dissolve -1 tsp of salt in 1 cup of warm water.  If your health care provider approves, you may use throat lozenges or sprays to soothe your throat. Contact a health care provider if:  You have large, tender lumps in your neck.  You have a rash.  You cough up green, yellow-brown, or bloody spit. Get help right away if:  Your neck becomes stiff.  You drool or are unable to swallow liquids.  You cannot drink or take medicines without vomiting.  You have severe pain that does not go away, even after you take medicine.  You have trouble breathing, and it is not caused by a stuffy nose.  You have new pain and swelling in your joints such as the knees, ankles, wrists, or elbows. Summary  Pharyngitis is redness, pain, and swelling (inflammation) of the throat (pharynx).  While pharyngitis can be caused by a bacteria, the most common causes are viral.  Most cases of pharyngitis get better on their own without treatment.  Bacterial pharyngitis is treated with antibiotic medicines. This information is not intended to replace advice given to you by your health care provider. Make sure you discuss any questions you have with your health care provider. Document Released: 09/06/2005 Document Revised: 10/12/2016 Document Reviewed: 10/12/2016 Elsevier Interactive Patient  Education  2018 [employer]. Sinusitis, Adult Sinusitis is soreness and inflammation of your sinuses. Sinuses are hollow spaces in the bones around your face. Your sinuses are located:  Around your eyes.  In the middle of your forehead.  Behind your nose.  In your cheekbones.  Your sinuses and nasal passages are lined with a stringy fluid (mucus). Mucus normally drains out of your sinuses. When your nasal tissues become inflamed or swollen, the mucus can become trapped or blocked so air cannot flow through your sinuses. This allows bacteria, viruses, and funguses to grow, which leads to infection. Sinusitis can develop quickly and last for 7?10 days (acute) or for more than 12 weeks (chronic). Sinusitis often develops after a cold. What are the causes? This condition is caused by anything that creates swelling in the sinuses or stops mucus from draining, including:  Allergies.  Asthma.  Bacterial or viral infection.  Abnormally shaped bones between the nasal passages.  Nasal growths that contain mucus (nasal polyps).  Narrow sinus openings.  Pollutants, such as chemicals or irritants in the air.  A foreign object stuck in the nose.  A fungal infection. This is rare.  What increases the risk? The following factors may make you more likely to develop this condition:  Having allergies or asthma.  Having had a recent cold or respiratory tract infection.  Having structural deformities or blockages in your nose or sinuses.  Having a weak immune system.  Doing a lot of swimming or diving.  Overusing nasal sprays.  Smoking.  What are the signs or symptoms? The main symptoms of this condition are pain and a feeling of pressure around the affected sinuses. Other symptoms include:  Upper toothache.  Earache.  Headache.  Bad breath.  Decreased sense of smell and taste.  A cough that may get worse at night.  Fatigue.  Fever.  Thick drainage from your nose.  The drainage is often green and it may contain pus (purulent).  Stuffy nose or congestion.  Postnasal drip. This is when extra mucus collects in the throat or back of the nose.  Swelling and warmth over the affected sinuses.  Sore throat.  Sensitivity to light.  How is this diagnosed? This condition is diagnosed based on symptoms, a medical history, and a physical exam. To find out if your condition is acute or chronic, your health care provider may:  Look in your nose for signs of nasal polyps.  Tap over the affected sinus to check for signs of infection.  View the inside of your sinuses using an imaging device that has a light attached (endoscope).  If your health care provider suspects that you have chronic sinusitis, you may also:  Be tested for allergies.  Have a sample of mucus taken from your nose (nasal culture) and checked for bacteria.  Have a mucus sample examined to see if your sinusitis is related to an allergy.  If your sinusitis does not respond to treatment and it lasts longer than 8 weeks, you may have an MRI or CT scan to check your sinuses. These scans also help to determine how severe your infection is. In rare cases, a bone biopsy may be done to rule out more serious types of fungal sinus disease. How is this treated? Treatment for sinusitis depends on the cause and whether your condition is chronic or acute. If a virus is causing your sinusitis, your symptoms will go away on their own within 10 days. You may be given medicines to relieve your symptoms, including:  Topical nasal decongestants. They shrink swollen nasal passages and let mucus drain from your sinuses.  Antihistamines. These drugs block inflammation that is triggered by allergies. This can help to ease swelling in your nose and sinuses.  Topical nasal corticosteroids. These are nasal sprays that ease inflammation and swelling in your nose and sinuses.  Nasal saline washes. These rinses can  help to get rid of thick mucus in your nose.  If your condition is caused by bacteria, you will be given an antibiotic medicine. If your condition is caused by a fungus, you will be given an antifungal medicine. Surgery may be needed to correct underlying conditions, such as narrow nasal passages. Surgery may also be needed to remove polyps. Follow these instructions at home: Medicines  Take, use, or apply over-the-counter and prescription medicines only as told by your health care provider. These may include nasal sprays.  If you were prescribed an antibiotic medicine, take it as told by your health care provider. Do not stop taking the antibiotic even if you start to feel better. Hydrate and Humidify  Drink enough water to keep your urine clear or pale yellow. Staying hydrated will help to thin your mucus.  Use a cool mist humidifier to keep the humidity level in your home above 50%.  Inhale steam for 10-15 minutes, 3-4 times a day or as told by your health care provider. You can do this in the bathroom while a hot shower is running.  Limit your exposure to cool or dry air. Rest  Rest as much as possible.  Sleep with your head raised (elevated).  Make sure to get enough sleep each night. General instructions  Apply a warm, moist washcloth to your face 3-4 times a day or as told by your health care provider. This will help with discomfort.  Wash your hands often with soap and water to reduce your exposure to viruses and other germs. If soap and water are not available, use hand sanitizer.  Do not smoke. Avoid being around people who are smoking (secondhand smoke).  Keep all follow-up visits as told by your health care provider. This is important. Contact a health care provider if:  You have a fever.  Your symptoms get worse.  Your symptoms do not improve within 10 days. Get help right away if:  You have a severe headache.  You have persistent vomiting.  You have pain  or swelling around your face or eyes.  You have vision problems.  You develop confusion.  Your neck is stiff.  You have trouble breathing. This information is not intended to replace advice given to you by your health care provider. Make sure  you discuss any questions you have with your health care provider. Document Released: 09/06/2005 Document Revised: 05/02/2016 Document Reviewed: 07/02/2015 Elsevier Interactive Patient Education  2018 [place]. Sinus Rinse What is a sinus rinse? A sinus rinse is a simple home treatment that is used to rinse your sinuses with a sterile mixture of salt and water (saline solution). Sinuses are air-filled spaces in your skull behind the bones of your face and forehead that open into your nasal cavity. You will use the following:  Saline solution.  Neti pot or spray bottle. This releases the saline solution into your nose and through your sinuses. Neti pots and spray bottles can be purchased at [employer], a health food store, or online.  When would I do a sinus rinse? A sinus rinse can help to clear mucus, dirt, dust, or pollen from the nasal cavity. You may do a sinus rinse when you have a cold, a virus, nasal allergy symptoms, a sinus infection, or stuffiness in the nose or sinuses. If you are considering a sinus rinse:  Ask your child's health care provider before performing a sinus rinse on your child.  Do not do a sinus rinse if you have had ear or nasal surgery, ear infection, or blocked ears.  How do I do a sinus rinse?  Wash your hands.  Disinfect your device according to the directions provided and then dry it.  Use the solution that comes with your device or one that is sold separately in stores. Follow the mixing directions on the package.  Fill your device with the amount of saline solution as directed by the device instructions.  Stand over a sink and tilt your head sideways over the sink.  Place the spout of the  device in your upper nostril (the one closer to the ceiling).  Gently pour or squeeze the saline solution into the nasal cavity. The liquid should drain to the lower nostril if you are not overly congested.  Gently blow your nose. Blowing too hard may cause ear pain.  Repeat in the other nostril.  Clean and rinse your device with clean water and then air-dry it. Are there risks of a sinus rinse? Sinus rinse is generally very safe and effective. However, there are a few risks, which include:  A burning sensation in the sinuses. This may happen if you do not make the saline solution as directed. Make sure to follow all directions when making the saline solution.  Infection from contaminated water. This is rare, but possible.  Nasal irritation.  This information is not intended to replace advice given to you by your health care provider. Make sure you discuss any questions you have with your health care provider. Document Released: 04/03/2014 Document Revised: 08/03/2016 Document Reviewed: 01/22/2014 Elsevier Interactive Patient Education  2017 [employer] Viral Illness, Adult Viruses are tiny germs that can get into a person's body and cause illness. There are many different types of viruses, and they cause many types of illness. Viral illnesses can range from mild to severe. They can affect various parts of the body. Common illnesses that are caused by a virus include colds and the flu. Viral illnesses also include serious conditions such as HIV/AIDS (human immunodeficiency virus/acquired immunodeficiency syndrome). A few viruses have been linked to certain cancers. What are the causes? Many types of viruses can cause illness. Viruses invade cells in your body, multiply, and cause the infected cells to malfunction or die. When the cell dies, it releases  more of the virus. When this happens, you develop symptoms of the illness, and the virus continues to spread to other cells. If the virus  takes over the function of the cell, it can cause the cell to divide and grow out of control, as is the case when a virus causes cancer. Different viruses get into the body in different ways. You can get a virus by:  Swallowing food or water that is contaminated with the virus.  Breathing in droplets that have been coughed or sneezed into the air by an infected person.  Touching a surface that has been contaminated with the virus and then touching your eyes, nose, or mouth.  Being bitten by an insect or animal that carries the virus.  Having sexual contact with a person who is infected with the virus.  Being exposed to blood or fluids that contain the virus, either through an open cut or during a transfusion.  If a virus enters your body, your body's defense system (immune system) will try to fight the virus. You may be at higher risk for a viral illness if your immune system is weak. What are the signs or symptoms? Symptoms vary depending on the type of virus and the location of the cells that it invades. Common symptoms of the main types of viral illnesses include: Cold and flu viruses  Fever.  Headache.  Sore throat.  Muscle aches.  Nasal congestion.  Cough. Digestive system (gastrointestinal) viruses  Fever.  Abdominal pain.  Nausea.  Diarrhea. Liver viruses (hepatitis)  Loss of appetite.  Tiredness.  Yellowing of the skin (jaundice). Brain and spinal cord viruses  Fever.  Headache.  Stiff neck.  Nausea and vomiting.  Confusion or sleepiness. Skin viruses  Warts.  Itching.  Rash. Sexually transmitted viruses  Discharge.  Swelling.  Redness.  Rash. How is this treated? Viruses can be difficult to treat because they live within cells. Antibiotic medicines do not treat viruses because these drugs do not get inside cells. Treatment for a viral illness may include:  Resting and drinking plenty of fluids.  Medicines to relieve symptoms.  These can include over-the-counter medicine for pain and fever, medicines for cough or congestion, and medicines to relieve diarrhea.  Antiviral medicines. These drugs are available only for certain types of viruses. They may help reduce flu symptoms if taken early. There are also many antiviral medicines for hepatitis and HIV/AIDS.  Some viral illnesses can be prevented with vaccinations. A common example is the flu shot. Follow these instructions at home: Medicines   Take over-the-counter and prescription medicines only as told by your health care provider.  If you were prescribed an antiviral medicine, take it as told by your health care provider. Do not stop taking the medicine even if you start to feel better.  Be aware of when antibiotics are needed and when they are not needed. Antibiotics do not treat viruses. If your health care provider thinks that you may have a bacterial infection as well as a viral infection, you may get an antibiotic. ? Do not ask for an antibiotic prescription if you have been diagnosed with a viral illness. That will not make your illness go away faster. ? Frequently taking antibiotics when they are not needed can lead to antibiotic resistance. When this develops, the medicine no longer works against the bacteria that it normally fights. General instructions  Drink enough fluids to keep your urine clear or pale yellow.  Rest as much as possible.  Return to your normal activities as told by your health care provider. Ask your health care provider what activities are safe for you.  Keep all follow-up visits as told by your health care provider. This is important. How is this prevented? Take these actions to reduce your risk of viral infection:  Eat a healthy diet and get enough rest.  Wash your hands often with soap and water. This is especially important when you are in public places. If soap and water are not available, use hand sanitizer.  Avoid close  contact with friends and family who have a viral illness.  If you travel to areas where viral gastrointestinal infection is common, avoid drinking water or eating raw food.  Keep your immunizations up to date. Get a flu shot every year as told by your health care provider.  Do not share toothbrushes, nail clippers, razors, or needles with other people.  Always practice safe sex.  Contact a health care provider if:  You have symptoms of a viral illness that do not go away.  Your symptoms come back after going away.  Your symptoms get worse. Get help right away if:  You have trouble breathing.  You have a severe headache or a stiff neck.  You have severe vomiting or abdominal pain. This information is not intended to replace advice given to you by your health care provider. Make sure you discuss any questions you have with your health care provider. Document Released: 01/16/2016 Document Revised: 02/18/2016 Document Reviewed: 01/16/2016 Elsevier Interactive Patient Education  [employer].

## 2017-12-14 ENCOUNTER — Ambulatory Visit: Payer: Self-pay | Admitting: Medical

## 2017-12-19 ENCOUNTER — Ambulatory Visit: Payer: Self-pay | Admitting: Medical

## 2017-12-19 VITALS — BP 118/68 | HR 61 | Temp 97.5°F | Resp 16 | Ht 68.0 in | Wt 153.4 lb

## 2017-12-19 DIAGNOSIS — J302 Other seasonal allergic rhinitis: Secondary | ICD-10-CM

## 2017-12-19 DIAGNOSIS — J029 Acute pharyngitis, unspecified: Secondary | ICD-10-CM

## 2017-12-19 LAB — POCT RAPID STREP A (OFFICE): RAPID STREP A SCREEN: NEGATIVE

## 2017-12-19 MED ORDER — FLUTICASONE PROPIONATE 50 MCG/ACT NA SUSP: 2.0000 | Freq: Every day | NASAL | 1 refills | Status: DC

## 2017-12-19 NOTE — Patient Instructions (Addendum)
Take OTC Zyrtec or Claritin take as directed. Fluticasone nasal spray Dilute salt water gargles four times a day.  What is this medicine? FLUTICASONE (floo TIK a sone) is a corticosteroid. This medicine is used to treat the symptoms of allergies like sneezing, itchy red eyes, and itchy, runny, or stuffy nose. This medicine is also used to treat nasal polyps. This medicine may be used for other purposes; ask your health care provider or pharmacist if you have questions. COMMON BRAND NAME(S): Flonase, Flonase Allergy Relief, Flonase Sensimist, Veramyst, XHANCE What should I tell my health care provider before I take this medicine? They need to know if you have any of these conditions: -cataracts -glaucoma -infection, like tuberculosis, herpes, or fungal infection -recent surgery on nose or sinuses -taking a corticosteroid by mouth -an unusual or allergic reaction to fluticasone, steroids, other medicines, foods, dyes, or preservatives -pregnant or trying to get pregnant -breast-feeding How should I use this medicine? This medicine is for use in the nose. Follow the directions on your product or prescription label. This medicine works best if used at regular intervals. Do not use more often than directed. Make sure that you are using your nasal spray correctly. After 6 months of daily use for allergies, talk to your doctor or health care professional before using it for a longer time. Ask your doctor or health care professional if you have any questions. Talk to your pediatrician regarding the use of this medicine in children. Special care may be needed. Some products have been used for allergies in children as young as 2 years. After 2 months of daily use without a prescription in a child, talk to your pediatrician before using it for a longer time. Use of this medicine for nasal polyps is not approved in children. Overdosage: If you think you have taken too much of this medicine contact a  poison control center or emergency room at once. NOTE: This medicine is only for you. Do not share this medicine with others. What if I miss a dose? If you miss a dose, use it as soon as you remember. If it is almost time for your next dose, use only that dose and continue with your regular schedule. Do not use double or extra doses. What may interact with this medicine? -certain antibiotics like clarithromycin and telithromycin -certain medicines for fungal infections like ketoconazole, itraconazole, and voriconazole -conivaptan -nefazodone -some medicines for HIV -vaccines This list may not describe all possible interactions. Give your health care provider a list of all the medicines, herbs, non-prescription drugs, or dietary supplements you use. Also tell them if you smoke, drink alcohol, or use illegal drugs. Some items may interact with your medicine. What should I watch for while using this medicine? Visit your doctor or health care professional for regular checks on your progress. Some symptoms may improve within 12 hours after starting use. Check with your doctor or health care professional if there is no improvement in your symptoms after 3 weeks of use. This medicine may increase your risk of getting an infection. Tell your doctor or health care professional if you are around anyone with measles or chickenpox, or if you develop sores or blisters that do not heal properly. What side effects may I notice from receiving this medicine? Side effects that you should report to your doctor or health care professional as soon as possible: -allergic reactions like skin rash, itching or hives, swelling of the face, lips, or tongue -changes in vision -  crusting or sores in the nose -nosebleed -signs and symptoms of infection like fever or chills; cough; sore throat -white patches or sores in the mouth or nose Side effects that usually do not require medical attention (report to your doctor or  health care professional if they continue or are bothersome): -burning or irritation inside the nose or throat -cough -headache -unusual taste or smell This list may not describe all possible side effects. Call your doctor for medical advice about side effects. You may report side effects to FDA at 1-800-FDA-1088. Where should I keep my medicine? Keep out of the reach of children. Store at room temperature between 15 and 30 degrees C (59 and 86 degrees F). Avoid exposure to extreme heat, cold, or light. Throw away any unused medicine after the expiration date. NOTE: This sheet is a summary. It may not cover all possible information. If you have questions about this medicine, talk to your doctor, pharmacist, or health care provider.  2018 Elsevier/Gold Standard (2016-06-18 14:23:12) Postnasal Drip Postnasal drip is the feeling of mucus going down the back of your throat. Mucus is a slimy substance that moistens and cleans your nose and throat, as well as the air pockets in face bones near your forehead and cheeks (sinuses). Small amounts of mucus pass from your nose and sinuses down the back of your throat all the time. This is normal. When you produce too much mucus or the mucus gets too thick, you can feel it. Some common causes of postnasal drip include:  Having more mucus because of: ? A cold or the flu. ? Allergies. ? Cold air. ? Certain medicines.  Having more mucus that is thicker because of: ? A sinus or nasal infection. ? Dry air. ? A food allergy.  Follow these instructions at home: Relieving discomfort  Gargle with a salt-water mixture 3-4 times a day or as needed. To make a salt-water mixture, completely dissolve -1 tsp of salt in 1 cup of warm water.  If the air in your home is dry, use a humidifier to add moisture to the air.  Use a saline spray or container (neti pot) to flush out the nose (nasal irrigation). These methods can help clear away mucus and keep the nasal  passages moist. General instructions  Take over-the-counter and prescription medicines only as told by your health care provider.  Follow instructions from your health care provider about eating or drinking restrictions. You may need to avoid caffeine.  Avoid things that you know you are allergic to (allergens), like dust, mold, pollen, pets, or certain foods.  Drink enough fluid to keep your urine pale yellow.  Keep all follow-up visits as told by your health care provider. This is important. Contact a health care provider if:  You have a fever.  You have a sore throat.  You have difficulty swallowing.  You have headache.  You have sinus pain.  You have a cough that does not go away.  The mucus from your nose becomes thick and is green or yellow in color.  You have cold or flu symptoms that last more than 10 days. Summary  Postnasal drip is the feeling of mucus going down the back of your throat.  If your health care provider approves, use nasal irrigation or a nasal spray 2?4 times a day.  Avoid things that you know you are allergic to (allergens), like dust, mold, pollen, pets, or certain foods. This information is not intended to replace advice given  to you by your health care provider. Make sure you discuss any questions you have with your health care provider. Document Released: 12/20/2016 Document Revised: 12/20/2016 Document Reviewed: 12/20/2016 Elsevier Interactive Patient Education  [employer].

## 2017-12-19 NOTE — Progress Notes (Signed)
   Subjective:    Patient ID: OLAJUWON FOSDICK, male    DOB: 01/05/1976, 42 y.o.   MRN: [id number]  HPI 42 yo male in non acute distress. ST on off since last seen x 2 weeks. Finished Augmentin seemed to improve. Two weeks later had the Norovirus. Now better as far as that illness.  Waking up at night with sore throat. Worse in the morning. . Last week had lymph nodes that were swollen submandibular. Denies any fever or chills.   Review of Systems  Constitutional: Negative for activity change, appetite change, chills, fatigue and fever.  HENT: Positive for congestion (mild in the am), sneezing (rarely) and sore throat. Negative for ear discharge, ear pain, postnasal drip, rhinorrhea, sinus pressure, sinus pain and voice change.   Eyes: Negative for discharge and itching.  Respiratory: Negative for cough, shortness of breath and wheezing.   Cardiovascular: Negative for chest pain, palpitations and leg swelling.  Gastrointestinal: Negative for abdominal pain.  Endocrine: Negative for polydipsia, polyphagia and polyuria.  Genitourinary: Negative for dysuria.  Musculoskeletal: Negative for myalgias.  Skin: Negative for rash.  Allergic/Immunologic: Positive for environmental allergies. Negative for food allergies.  Neurological: Negative for dizziness, syncope and headaches.  Hematological: Positive for adenopathy (last week).  Psychiatric/Behavioral: Negative for behavioral problems, self-injury and suicidal ideas. The patient is not nervous/anxious.        Objective:   Physical Exam  Constitutional: He is oriented to person, place, and time. He appears well-developed and well-nourished.  HENT:  Head: Normocephalic and atraumatic.  Right Ear: Hearing, external ear and ear canal normal. A middle ear effusion is present.  Left Ear: Hearing, external ear and ear canal normal. A middle ear effusion is present.  Nose: Nose normal.  Mouth/Throat: Uvula is midline and mucous membranes are  normal. Uvula swelling present. Posterior oropharyngeal erythema present.  Eyes: Pupils are equal, round, and reactive to light. Conjunctivae and EOM are normal.  Neck: Normal range of motion. Neck supple.  Cardiovascular: Normal rate, regular rhythm and normal heart sounds.  Pulmonary/Chest: Effort normal and breath sounds normal.  Lymphadenopathy:    He has cervical adenopathy (mild).  Neurological: He is alert and oriented to person, place, and time.  Skin: Skin is warm and dry.  Psychiatric: He has a normal mood and affect. His behavior is normal. Judgment and thought content normal.  Nursing note and vitals reviewed.  Enlarged tonsil on the left. He is not sure if this is his baseline. Patient more painful on the right side with swallowing. Uvula edematous, midline.  Throat culture pending Strep test negative    Assessment & Plan:  Seasonal allergies Pharyngtis,PND,  dilute salt water gargles  4x / day, Avoid acidic foods, OTC Futicalsone NS take as directed. Add in OTC Zyrtec or Claritin take as directed. Return to the clinic in 5 -7 days if not improving.Sooner if any concerns. Throat culture pending Meds ordered this encounter  Medications  . fluticasone (FLONASE) 50 MCG/ACT nasal spray    Sig: Place 2 sprays into both nostrils daily.    Dispense:  16 g    Refill:  1   Will call patient with results of throat culture once received.

## 2017-12-21 LAB — CULTURE, GROUP A STREP: STREP A CULTURE: NEGATIVE

## 2017-12-26 NOTE — Progress Notes (Signed)
LM for him to call back

## 2018-06-20 ENCOUNTER — Ambulatory Visit: Payer: Self-pay | Admitting: Medical

## 2018-06-20 ENCOUNTER — Other Ambulatory Visit: Payer: Self-pay

## 2018-06-20 ENCOUNTER — Encounter: Payer: Self-pay | Admitting: Medical

## 2018-06-20 VITALS — BP 124/68 | HR 54 | Temp 97.6°F | Resp 16 | Wt 155.4 lb

## 2018-06-20 DIAGNOSIS — K625 Hemorrhage of anus and rectum: Secondary | ICD-10-CM

## 2018-06-20 MED ORDER — NA SULFATE-K SULFATE-MG SULF 17.5-3.13-1.6 GM/177ML PO SOLN: 1.0000 | Freq: Once | ORAL | 0 refills | Status: AC

## 2018-06-20 NOTE — Patient Instructions (Signed)
Rectal Bleeding °Rectal bleeding is when blood comes out of the opening of the butt (anus). People with this kind of bleeding may notice bright red blood in their underwear or in the toilet after they poop (have a bowel movement). They may also have dark red or black poop (stool). Rectal bleeding is often a sign that something is wrong. It needs to be checked by a doctor. °Follow these instructions at home: °Watch for any changes in your condition. Take these actions to help with bleeding and discomfort: °· Eat a diet that is high in fiber. This will keep your poop soft so it is easier for you to poop without pushing too hard. Ask your doctor to tell you what foods and drinks are high in fiber. °· Drink enough fluid to keep your pee (urine) clear or pale yellow. This also helps keep your poop soft. °· Try taking a warm bath. This may help with pain. °· Keep all follow-up visits as told by your doctor. This is important. ° °Get help right away if: °· You have new bleeding. °· You have more bleeding than before. °· You have black or dark red poop. °· You throw up (vomit) blood or something that looks like coffee grounds. °· You have pain or tenderness in your belly (abdomen). °· You have a fever. °· You feel weak. °· You feel sick to your stomach (nauseous). °· You pass out (faint). °· You have very bad pain in your butt. °· You cannot poop. °This information is not intended to replace advice given to you by your health care provider. Make sure you discuss any questions you have with your health care provider. °Document Released: 05/19/2011 Document Revised: 02/12/2016 Document Reviewed: 11/02/2015 °Elsevier Interactive Patient Education © 2018 Elsevier Inc. ° °

## 2018-06-20 NOTE — Progress Notes (Signed)
   Subjective:    Patient ID: Randall Evans, male    DOB: [birth year], 42 y.o.   MRN: [id number]  HPI 42 yo male in non acute distress.  Started on Saturday after bowel movement, bright red blood  dripping into toilet and then with wiping after wiping twice seemed to resolve however did have blood on underwear on Saturday. No previous history of hemorrhoids.  No dark, no black or tarry stools. No abdominal pain..No fever or chills or fatigue.   Blood pressure 124/68, pulse (!) 54, temperature 97.6 F (36.4 C), temperature source Tympanic, resp. rate 16, weight 155 lb 6.4 oz (70.5 kg), SpO2 100 %. No Known Allergies   Review of Systems  Constitutional: Negative for chills, fatigue and fever.  HENT: Negative for congestion, ear pain and sore throat.   Eyes: Negative for discharge, itching and visual disturbance.  Respiratory: Negative for cough and shortness of breath.   Cardiovascular: Negative for chest pain, palpitations and leg swelling.  Gastrointestinal: Positive for anal bleeding. Negative for abdominal distention, abdominal pain, blood in stool, constipation, diarrhea and rectal pain.  Endocrine: Negative for polydipsia, polyphagia and polyuria.  Genitourinary: Negative for dysuria.  Musculoskeletal: Negative for myalgias.  Skin: Negative for rash.  Allergic/Immunologic: Positive for environmental allergies (springtime). Negative for food allergies and immunocompromised state.  Neurological: Negative for dizziness, syncope and light-headedness.  Hematological: Negative for adenopathy. Does not bruise/bleed easily.  Psychiatric/Behavioral: Negative for agitation, behavioral problems, self-injury and suicidal ideas.    No primary care doctor.  Patient with history of Melanoma   History of colon cancer in family   He states" somewhere in the family but denies it in mother , father or siblings" not quite sure who the family member is.  No pain on sitting nor uncomfortable.   loose stool on yesterday , seen no blood at that time. Objective:   Physical Exam  Constitutional: He is oriented to person, place, and time. He appears well-developed and well-nourished.  HENT:  Head: Normocephalic and atraumatic.  Eyes: Pupils are equal, round, and reactive to light. Conjunctivae and EOM are normal.  Neck: Normal range of motion.  Cardiovascular: Normal rate, regular rhythm and normal heart sounds.  Pulmonary/Chest: Effort normal and breath sounds normal.  Genitourinary: Prostate normal. Rectal exam shows guaiac positive stool. Rectal exam shows no external hemorrhoid, no internal hemorrhoid, no fissure, no mass, no tenderness and anal tone normal.  Neurological: He is alert and oriented to person, place, and time.  Skin: Skin is warm and dry.  Psychiatric: He has a normal mood and affect. His behavior is normal. Judgment and thought content normal.  Nursing note and vitals reviewed.  Non tender on rectal exam. guiaic positive    Assessment & Plan:  Rectal bleeding  GI referral, should hear form them in the next week. CBC and Met C ordered will contact l patient with results. Last blood work 6 yrs ago.  Given [facility] referral information to set up primary care provider.  Return to clinic as needed. Patient verbalizes understanding and has no questions at discharge.  Labs reviwed only RDW decreased and BUN ratio elevated minimally.MyChart message sent to patient.   If worsening bleeding, dizziness, lightheadedness or syncope to go to the nearest Emergency Department. Patient verbalizes understanding and has no questions at discharge.

## 2018-06-21 ENCOUNTER — Encounter: Payer: Self-pay | Admitting: Medical

## 2018-06-21 LAB — CBC WITH DIFFERENTIAL/PLATELET
BASOS ABS: 0 10*3/uL (ref 0.0–0.2)
BASOS: 0 %
EOS (ABSOLUTE): 0.2 10*3/uL (ref 0.0–0.4)
Eos: 4 %
Hematocrit: 41.9 % (ref 37.5–51.0)
Hemoglobin: 14.7 g/dL (ref 13.0–17.7)
IMMATURE GRANS (ABS): 0 10*3/uL (ref 0.0–0.1)
IMMATURE GRANULOCYTES: 0 %
LYMPHS: 34 %
Lymphocytes Absolute: 1.6 10*3/uL (ref 0.7–3.1)
MCH: 30.9 pg (ref 26.6–33.0)
MCHC: 35.1 g/dL (ref 31.5–35.7)
MCV: 88 fL (ref 79–97)
MONOS ABS: 0.4 10*3/uL (ref 0.1–0.9)
Monocytes: 8 %
Neutrophils Absolute: 2.5 10*3/uL (ref 1.4–7.0)
Neutrophils: 54 %
PLATELETS: 198 10*3/uL (ref 150–450)
RBC: 4.76 x10E6/uL (ref 4.14–5.80)
RDW: 12.1 % — AB (ref 12.3–15.4)
WBC: 4.6 10*3/uL (ref 3.4–10.8)

## 2018-06-21 LAB — COMPREHENSIVE METABOLIC PANEL
ALT: 19 IU/L (ref 0–44)
AST: 20 IU/L (ref 0–40)
Albumin/Globulin Ratio: 2 (ref 1.2–2.2)
Albumin: 4.5 g/dL (ref 3.5–5.5)
Alkaline Phosphatase: 47 IU/L (ref 39–117)
BUN / CREAT RATIO: 21 — AB (ref 9–20)
BUN: 17 mg/dL (ref 6–24)
Bilirubin Total: 0.6 mg/dL (ref 0.0–1.2)
CALCIUM: 9.4 mg/dL (ref 8.7–10.2)
CHLORIDE: 104 mmol/L (ref 96–106)
CO2: 25 mmol/L (ref 20–29)
Creatinine, Ser: 0.81 mg/dL (ref 0.76–1.27)
GFR calc Af Amer: 129 mL/min/{1.73_m2} (ref >59)
GFR calc non Af Amer: 111 mL/min/{1.73_m2} (ref >59)
GLUCOSE: 93 mg/dL (ref 65–99)
Globulin, Total: 2.3 g/dL (ref 1.5–4.5)
Potassium: 4.3 mmol/L (ref 3.5–5.2)
SODIUM: 144 mmol/L (ref 134–144)
TOTAL PROTEIN: 6.8 g/dL (ref 6.0–8.5)

## 2018-06-27 ENCOUNTER — Encounter: Payer: Self-pay | Admitting: *Deleted

## 2018-06-27 ENCOUNTER — Other Ambulatory Visit: Payer: Self-pay

## 2018-06-30 NOTE — Discharge Instructions (Signed)
General Anesthesia, Adult, Care After °These instructions provide you with information about caring for yourself after your procedure. Your health care provider may also give you more specific instructions. Your treatment has been planned according to current medical practices, but problems sometimes occur. Call your health care provider if you have any problems or questions after your procedure. °What can I expect after the procedure? °After the procedure, it is common to have: °· Vomiting. °· A sore throat. °· Mental slowness. ° °It is common to feel: °· Nauseous. °· Cold or shivery. °· Sleepy. °· Tired. °· Sore or achy, even in parts of your body where you did not have surgery. ° °Follow these instructions at home: °For at least 24 hours after the procedure: °· Do not: °? Participate in activities where you could fall or become injured. °? Drive. °? Use heavy machinery. °? Drink alcohol. °? Take sleeping pills or medicines that cause drowsiness. °? Make important decisions or sign legal documents. °? Take care of children on your own. °· Rest. °Eating and drinking °· If you vomit, drink water, juice, or soup when you can drink without vomiting. °· Drink enough fluid to keep your urine clear or pale yellow. °· Make sure you have little or no nausea before eating solid foods. °· Follow the diet recommended by your health care provider. °General instructions °· Have a responsible adult stay with you until you are awake and alert. °· Return to your normal activities as told by your health care provider. Ask your health care provider what activities are safe for you. °· Take over-the-counter and prescription medicines only as told by your health care provider. °· If you smoke, do not smoke without supervision. °· Keep all follow-up visits as told by your health care provider. This is important. °Contact a health care provider if: °· You continue to have nausea or vomiting at home, and medicines are not helpful. °· You  cannot drink fluids or start eating again. °· You cannot urinate after 8-12 hours. °· You develop a skin rash. °· You have fever. °· You have increasing redness at the site of your procedure. °Get help right away if: °· You have difficulty breathing. °· You have chest pain. °· You have unexpected bleeding. °· You feel that you are having a life-threatening or urgent problem. °This information is not intended to replace advice given to you by your health care provider. Make sure you discuss any questions you have with your health care provider. °Document Released: 12/13/2000 Document Revised: 02/09/2016 Document Reviewed: 08/21/2015 °Elsevier Interactive Patient Education © 2018 Elsevier Inc. ° °

## 2018-07-03 ENCOUNTER — Ambulatory Visit: Payer: BLUE CROSS/BLUE SHIELD | Admitting: Anesthesiology

## 2018-07-03 ENCOUNTER — Encounter
Admission: RE | Disposition: A | Discharge status: Home / Self Care | Payer: Self-pay | Source: Ambulatory Visit | Attending: Gastroenterology

## 2018-07-03 ENCOUNTER — Ambulatory Visit
Admission: RE | Admit: 2018-07-03 | Discharge: 2018-07-03 | Disposition: A | Discharge status: Home / Self Care | Payer: BLUE CROSS/BLUE SHIELD | Source: Ambulatory Visit | Attending: Gastroenterology | Admitting: Gastroenterology

## 2018-07-03 DIAGNOSIS — K641 Second degree hemorrhoids: Secondary | ICD-10-CM | POA: Insufficient documentation

## 2018-07-03 DIAGNOSIS — K921 Melena: Secondary | ICD-10-CM | POA: Diagnosis not present

## 2018-07-03 DIAGNOSIS — K625 Hemorrhage of anus and rectum: Secondary | ICD-10-CM

## 2018-07-03 DIAGNOSIS — K648 Other hemorrhoids: Secondary | ICD-10-CM

## 2018-07-03 HISTORY — DX: Presence of spectacles and contact lenses: Z97.3

## 2018-07-03 HISTORY — PX: COLONOSCOPY WITH PROPOFOL: SHX5780

## 2018-07-03 HISTORY — DX: Cardiac murmur, unspecified: R01.1

## 2018-07-03 SURGERY — COLONOSCOPY WITH PROPOFOL
Anesthesia: General | Site: Rectum

## 2018-07-03 MED ORDER — PROPOFOL 10 MG/ML IV BOLUS
INTRAVENOUS | Status: DC | PRN
  Administered 2018-07-03: 30 mg via INTRAVENOUS
  Administered 2018-07-03: 80 mg via INTRAVENOUS
  Administered 2018-07-03: 20 mg via INTRAVENOUS
  Administered 2018-07-03: 40 mg via INTRAVENOUS
  Administered 2018-07-03: 30 mg via INTRAVENOUS
  Administered 2018-07-03 (×2): 50 mg via INTRAVENOUS
  Administered 2018-07-03: 40 mg via INTRAVENOUS
  Administered 2018-07-03: 20 mg via INTRAVENOUS
  Administered 2018-07-03: 50 mg via INTRAVENOUS
  Administered 2018-07-03: 20 mg via INTRAVENOUS

## 2018-07-03 MED ORDER — STERILE WATER FOR IRRIGATION IR SOLN
Status: DC | PRN
  Administered 2018-07-03: 12:00:00

## 2018-07-03 MED ORDER — SODIUM CHLORIDE 0.9 % IV SOLN: INTRAVENOUS | Status: DC

## 2018-07-03 MED ORDER — LIDOCAINE HCL (CARDIAC) PF 100 MG/5ML IV SOSY
PREFILLED_SYRINGE | INTRAVENOUS | Status: DC | PRN
  Administered 2018-07-03: 40 mg via INTRAVENOUS

## 2018-07-03 MED ORDER — LACTATED RINGERS IV SOLN
1000.0000 mL | INTRAVENOUS | Status: DC
  Administered 2018-07-03: 1000 mL via INTRAVENOUS

## 2018-07-03 SURGICAL SUPPLY — 5 items
CANISTER SUCT 1200ML W/VALVE (MISCELLANEOUS) ×2 IMPLANT
GOWN CVR UNV OPN BCK APRN NK (MISCELLANEOUS) ×2 IMPLANT
GOWN ISOL THUMB LOOP REG UNIV (MISCELLANEOUS) ×4
KIT ENDO PROCEDURE OLY (KITS) ×2 IMPLANT
WATER STERILE IRR 250ML POUR (IV SOLUTION) ×2 IMPLANT

## 2018-07-03 NOTE — Anesthesia Postprocedure Evaluation (Signed)
Anesthesia Post Note  Patient: Randall Evans  Procedure(s) Performed: COLONOSCOPY WITH PROPOFOL (N/A Rectum)  Patient location during evaluation: PACU Anesthesia Type: General Level of consciousness: awake and alert Pain management: pain level controlled Vital Signs Assessment: post-procedure vital signs reviewed and stable Respiratory status: spontaneous breathing, nonlabored ventilation, respiratory function stable and patient connected to nasal cannula oxygen Cardiovascular status: blood pressure returned to baseline and stable Postop Assessment: no apparent nausea or vomiting Anesthetic complications: no    Trecia Rogers

## 2018-07-03 NOTE — Transfer of Care (Signed)
Immediate Anesthesia Transfer of Care Note  Patient: Randall Evans  Procedure(s) Performed: COLONOSCOPY WITH PROPOFOL (N/A Rectum)  Patient Location: PACU  Anesthesia Type: General  Level of Consciousness: awake, alert  and patient cooperative  Airway and Oxygen Therapy: Patient Spontanous Breathing and Patient connected to supplemental oxygen  Post-op Assessment: Post-op Vital signs reviewed, Patient's Cardiovascular Status Stable, Respiratory Function Stable, Patent Airway and No signs of Nausea or vomiting  Post-op Vital Signs: Reviewed and stable  Complications: No apparent anesthesia complications

## 2018-07-03 NOTE — H&P (Signed)
Randall Lame, MD [address] [place], [place] [address] Phone:[phone] Fax : [phone]  Primary Care Physician:  Talmage Nap, PA-C Primary Gastroenterologist:  Dr. Allen Norris  Pre-Procedure History & Physical: HPI:  Randall Evans is a 42 y.o. male is here for an colonoscopy.   Past Medical History:  Diagnosis Date  . Heart murmur    Childhood. Resolved.  . Wears contact lenses     Past Surgical History:  Procedure Laterality Date  . MANDIBLE SURGERY    . MELANOMA EXCISION  2011   chest    Prior to Admission medications   Not on File    Allergies as of 06/20/2018  . (No Known Allergies)    History reviewed. No pertinent family history.  Social History   Socioeconomic History  . Marital status: Married    Spouse name: Not on file  . Number of children: Not on file  . Years of education: Not on file  . Highest education level: Not on file  Occupational History  . Not on file  Social Needs  . Financial resource strain: Not on file  . Food insecurity:    Worry: Not on file    Inability: Not on file  . Transportation needs:    Medical: Not on file    Non-medical: Not on file  Tobacco Use  . Smoking status: Never Smoker  . Smokeless tobacco: Never Used  Substance and Sexual Activity  . Alcohol use: Yes    Alcohol/week: 3.0 standard drinks    Types: 3 Cans of beer per week  . Drug use: No  . Sexual activity: Not on file  Lifestyle  . Physical activity:    Days per week: Not on file    Minutes per session: Not on file  . Stress: Not on file  Relationships  . Social connections:    Talks on phone: Not on file    Gets together: Not on file    Attends religious service: Not on file    Active member of club or organization: Not on file    Attends meetings of clubs or organizations: Not on file    Relationship status: Not on file  . Intimate partner violence:    Fear of current or ex partner: Not on file    Emotionally  abused: Not on file    Physically abused: Not on file    Forced sexual activity: Not on file  Other Topics Concern  . Not on file  Social History Narrative  . Not on file    Review of Systems: See HPI, otherwise negative ROS  Physical Exam: BP 118/73   Pulse 62   Temp 98.2 F (36.8 C) (Oral)   Resp 16   Ht 5\' 8"  (1.727 m)   Wt 66.7 kg   SpO2 100%   BMI 22.35 kg/m  General:   Alert,  pleasant and cooperative in NAD Head:  Normocephalic and atraumatic. Neck:  Supple; no masses or thyromegaly. Lungs:  Clear throughout to auscultation.    Heart:  Regular rate and rhythm. Abdomen:  Soft, nontender and nondistended. Normal bowel sounds, without guarding, and without rebound.   Neurologic:  Alert and  oriented x4;  grossly normal neurologically.  Impression/Plan: Randall Evans is here for an colonoscopy to be performed for hematochezia  Risks, benefits, limitations, and alternatives regarding  colonoscopy have been reviewed with the patient.  Questions have been answered.  All parties agreeable.   Brantly Kalman  Allen Norris, MD  07/03/2018, 11:53 AM

## 2018-07-03 NOTE — Op Note (Signed)
[facility] Patient Name: Randall Evans Procedure Date: 07/03/2018 12:01 PM MRN: [id number] Account #: [id number] Date of Birth: [birth year] Admit Type: Outpatient Age: 42 Room: [facility] OR ROOM 01 Gender: Male Note Status: Finalized Procedure:            Colonoscopy Indications:          Hematochezia Providers:            [name] MD, MD Referring MD:         Estill Dooms. Ratcliffe, MD (Referring MD) Medicines:            Propofol per Anesthesia Complications:        No immediate complications. Procedure:            Pre-Anesthesia Assessment:                       - Prior to the procedure, a History and Physical was                        performed, and patient medications and allergies were                        reviewed. The patient's tolerance of previous                        anesthesia was also reviewed. The risks and benefits of                        the procedure and the sedation options and risks were                        discussed with the patient. All questions were                        answered, and informed consent was obtained. Prior                        Anticoagulants: The patient has taken no previous                        anticoagulant or antiplatelet agents. ASA Grade                        Assessment: I - A normal, healthy patient. After                        reviewing the risks and benefits, the patient was                        deemed in satisfactory condition to undergo the                        procedure.                       After obtaining informed consent, the colonoscope was                        passed under direct vision. Throughout the procedure,  the patient's blood pressure, pulse, and oxygen                        saturations were monitored continuously. The was                        introduced through the anus and advanced to the the                        cecum, identified  by appendiceal orifice and ileocecal                        valve. The colonoscopy was performed without                        difficulty. The patient tolerated the procedure well.                        The quality of the bowel preparation was excellent. Findings:      The perianal and digital rectal examinations were normal.      Non-bleeding internal hemorrhoids were found during retroflexion. The       hemorrhoids were Grade II (internal hemorrhoids that prolapse but reduce       spontaneously). Impression:           - Non-bleeding internal hemorrhoids.                       - No specimens collected. Recommendation:       - Discharge patient to home.                       - Resume previous diet.                       - Continue present medications.                       - Repeat colonoscopy in 10 years for screening unless                        any change in family history or lower GI problems. Procedure Code(s):    --- Professional ---                       [phone], Colonoscopy, flexible; diagnostic, including                        collection of specimen(s) by brushing or washing, when                        performed (separate procedure) Diagnosis Code(s):    --- Professional ---                       K92.1, Melena (includes Hematochezia) CPT copyright 2018 American Medical Association. All rights reserved. The codes documented in this report are preliminary and upon coder review may  be revised to meet current compliance requirements. [name] MD, MD 07/03/2018 12:25:56 PM This report has been signed electronically. Number of Addenda: 0 Note Initiated On: 07/03/2018 12:01 PM Scope Withdrawal Time: 0 hours 6 minutes 41 seconds  Total Procedure Duration: 0  hours 13 minutes 7 seconds       [facility]

## 2018-07-03 NOTE — Anesthesia Preprocedure Evaluation (Addendum)
Anesthesia Evaluation  Patient identified by MRN, date of birth, ID band Patient awake    Reviewed: Allergy & Precautions, H&P , NPO status , Patient's Chart, lab work & pertinent test results, reviewed documented beta blocker date and time   Airway Mallampati: I  TM Distance: >3 FB Neck ROM: full    Dental no notable dental hx.    Pulmonary neg pulmonary ROS,    Pulmonary exam normal breath sounds clear to auscultation       Cardiovascular Exercise Tolerance: Good negative cardio ROS Normal cardiovascular exam Rhythm:regular Rate:Normal     Neuro/Psych negative neurological ROS  negative psych ROS   GI/Hepatic negative GI ROS, Neg liver ROS,   Endo/Other  negative endocrine ROS  Renal/GU negative Renal ROS  negative genitourinary   Musculoskeletal   Abdominal   Peds  Hematology negative hematology ROS (+)   Anesthesia Other Findings   Reproductive/Obstetrics negative OB ROS                             Anesthesia Physical Anesthesia Plan  ASA: I  Anesthesia Plan: General   Post-op Pain Management:    Induction:   PONV Risk Score and Plan:   Airway Management Planned:   Additional Equipment:   Intra-op Plan:   Post-operative Plan:   Informed Consent: I have reviewed the patients History and Physical, chart, labs and discussed the procedure including the risks, benefits and alternatives for the proposed anesthesia with the patient or authorized representative who has indicated his/her understanding and acceptance.   Dental Advisory Given  Plan Discussed with: CRNA  Anesthesia Plan Comments:         Anesthesia Quick Evaluation

## 2018-07-03 NOTE — Anesthesia Procedure Notes (Signed)
Performed by: Shalondra Wunschel, Zafar, CRNA Pre-anesthesia Checklist: Patient identified, Emergency Drugs available, Suction available, Timeout performed and Patient being monitored Patient Re-evaluated:Patient Re-evaluated prior to induction Oxygen Delivery Method: Nasal cannula Placement Confirmation: positive ETCO2       

## 2018-07-04 ENCOUNTER — Encounter: Payer: Self-pay | Admitting: Gastroenterology

## 2018-08-08 ENCOUNTER — Ambulatory Visit: Payer: Self-pay | Admitting: Medical

## 2018-08-08 ENCOUNTER — Encounter: Payer: Self-pay | Admitting: Medical

## 2018-08-08 VITALS — BP 134/69 | HR 65 | Temp 96.8°F | Resp 16 | Wt 163.0 lb

## 2018-08-08 DIAGNOSIS — J0100 Acute maxillary sinusitis, unspecified: Secondary | ICD-10-CM

## 2018-08-08 MED ORDER — AMOXICILLIN-POT CLAVULANATE 875-125 MG PO TABS: 1.0000 | ORAL_TABLET | Freq: Two times a day (BID) | ORAL | 0 refills | Status: DC

## 2018-08-08 NOTE — Patient Instructions (Signed)

## 2018-08-08 NOTE — Progress Notes (Signed)
   Subjective:    Patient ID: Randall Evans, male    DOB: 06-Jan-1976, 42 y.o.   MRN: [id number]  HPI 42 yo male in non acute distress. Complains of sore throat x one week., not feeling well, then on Thursday with sinus pain and pressure in cheeks. Has cough at night when he lays down, denies shortness of breath or chest pain. Using Afrin NS,   Daughter sick last week treated with nebulizeand got betterr, no history of asthma.  Blood pressure 134/69, pulse 65, temperature (!) 96.8 F (36 C), temperature source Tympanic, resp. rate 16, weight 163 lb (73.9 kg), SpO2 98 %. No Known Allergies  Review of Systems  Constitutional: Positive for fever (possible). Negative for chills.  HENT: Positive for congestion, ear pain (pressure only bilaterally), postnasal drip, rhinorrhea, sinus pressure, sinus pain and sore throat. Negative for sneezing.   Eyes: Negative for discharge and itching.  Respiratory: Positive for cough (at night) and wheezing (initially but not now). Negative for shortness of breath.   Cardiovascular: Negative for chest pain.  Gastrointestinal: Negative for abdominal pain.  Endocrine: Negative for polydipsia, polyphagia and polyuria.  Genitourinary: Negative for dysuria.  Musculoskeletal: Negative for myalgias.  Skin: Negative for rash.  Allergic/Immunologic: Negative for environmental allergies and food allergies.  Neurological: Positive for headaches (last week forehead). Negative for dizziness, syncope and light-headedness.  Hematological: Negative for adenopathy.  Psychiatric/Behavioral: Negative for behavioral problems, confusion, self-injury and suicidal ideas.       Objective:   Physical Exam  Constitutional: He is oriented to person, place, and time. He appears well-developed and well-nourished.  HENT:  Head: Normocephalic and atraumatic.  Right Ear: Hearing, external ear and ear canal normal. A middle ear effusion is present.  Left Ear: Hearing, external ear  and ear canal normal. A middle ear effusion is present.  Nose: Mucosal edema and rhinorrhea present. Right sinus exhibits no maxillary sinus tenderness and no frontal sinus tenderness. Left sinus exhibits no maxillary sinus tenderness and no frontal sinus tenderness.  Mouth/Throat: Uvula is midline, oropharynx is clear and moist and mucous membranes are normal. Tonsils are 2+ on the right. Tonsils are 2+ on the left.  Eyes: Pupils are equal, round, and reactive to light. Conjunctivae and EOM are normal.  Neck: Normal range of motion. Neck supple.  Cardiovascular: Normal rate, regular rhythm and normal heart sounds.  Pulmonary/Chest: Effort normal and breath sounds normal.  Neurological: He is alert and oriented to person, place, and time.  Skin: Skin is warm and dry.  Psychiatric: He has a normal mood and affect. His behavior is normal. Judgment and thought content normal.  Nursing note and vitals reviewed.         Assessment & Plan:  Sinusitis, ETD bilaterally Meds ordered this encounter  Medications  . amoxicillin-clavulanate (AUGMENTIN) 875-125 MG tablet    Sig: Take 1 tablet by mouth 2 (two) times daily.    Dispense:  20 tablet    Refill:  0  Stop Afrin spray must be off of medication x 7 days before using it again for 4 days only. Rest, increase fluids, OTC Zyrtec or Claritin and OTC Flonase NS per package directions. Return in 3-5 days if not improving. Patient verbalizes understanding and has no questions at discharge.

## 2019-06-19 ENCOUNTER — Telehealth: Payer: Self-pay | Admitting: *Deleted

## 2019-06-19 NOTE — Telephone Encounter (Signed)
Randall Evans called Randall Evans and Randall Evans today with c/o "sore throat and mild headache" that began last night. Denies any other symptoms, afebrile. Pt does want to be responsible and get tested before returning to work at [employer]. He has already called a [facility] and has an appt for later today. Also informed pt about the drive through Covid testing at [place] with a 4-7 day turn around time. Randall Evans states he will call [facility] to see what their turn around time is for results and make his decision on where to go for testing based on this information. Pt also states he remembers these same symptoms occurring twice a year so could also be allergies. Advised him to begin taking Claritin, Allegra, or Zyrtec daily to treat symptoms, no need to wait for results of Covid test to start treating if this is allergy related. Randall Evans verbalizes understanding of topics discussed and has no further needs or concerns at this time. He expresses gratitude for my return call.

## 2019-06-21 ENCOUNTER — Other Ambulatory Visit: Payer: Self-pay

## 2019-06-21 ENCOUNTER — Encounter: Payer: Self-pay | Admitting: Nurse Practitioner

## 2019-06-21 ENCOUNTER — Ambulatory Visit: Payer: Self-pay | Admitting: Nurse Practitioner

## 2019-06-21 VITALS — BP 129/71 | HR 54 | Temp 98.1°F | Resp 16 | Ht 68.0 in | Wt 152.0 lb

## 2019-06-21 DIAGNOSIS — H66003 Acute suppurative otitis media without spontaneous rupture of ear drum, bilateral: Secondary | ICD-10-CM

## 2019-06-21 DIAGNOSIS — J039 Acute tonsillitis, unspecified: Secondary | ICD-10-CM

## 2019-06-21 DIAGNOSIS — J029 Acute pharyngitis, unspecified: Secondary | ICD-10-CM

## 2019-06-21 LAB — POCT RAPID STREP A (OFFICE): Rapid Strep A Screen: NEGATIVE

## 2019-06-21 MED ORDER — AMOXICILLIN 875 MG PO TABS: 875.0000 mg | ORAL_TABLET | Freq: Two times a day (BID) | ORAL | 0 refills | Status: DC

## 2019-06-21 NOTE — Patient Instructions (Addendum)
Randall Evans, I encourage you to make sure you continue to get adequate fluids and rest while your body recovers Take over the counter ibuprofen as needed for the sore throat and to help with the inflammation Hold the Claritin and start Allegra daily I will send the antibiotic and take as directed with food Encouraged patient to call the office or primary care doctor for an appointment if no improvement in symptoms or if symptoms change or worsen after 72 hours of planned treatment. Patient verbalized understanding of all instructions given/reviewed and has no further questions or concerns at this time.     Otitis Media, Adult   Otitis media means that the middle ear is red and swollen (inflamed) and full of fluid. The condition usually goes away on its own. Follow these instructions at home:  Take over-the-counter and prescription medicines only as told by your doctor.  If you were prescribed an antibiotic medicine, take it as told by your doctor. Do not stop taking the antibiotic even if you start to feel better.  Keep all follow-up visits as told by your doctor. This is important. Contact a doctor if:  You have bleeding from your nose.  There is a lump on your neck.  You are not getting better in 5 days.  You feel worse instead of better. Get help right away if:  You have pain that is not helped with medicine.  You have swelling, redness, or pain around your ear.  You get a stiff neck.  You cannot move part of your face (paralyzed).  You notice that the bone behind your ear hurts when you touch it.  You get a very bad headache. Summary  Otitis media means that the middle ear is red, swollen, and full of fluid.  This condition usually goes away on its own. In some cases, treatment may be needed.  If you were prescribed an antibiotic medicine, take it as told by your doctor. This information is not intended to replace advice given to you by your health care provider. Make  sure you discuss any questions you have with your health care provider. Document Released: 02/23/2008 Document Revised: 08/19/2017 Document Reviewed: 09/27/2016 Elsevier Patient Education  [place].   Sore Throat When you have a sore throat, your throat may feel:  Tender.  Burning.  Irritated.  Scratchy.  Painful when you swallow.  Painful when you talk. Many things can cause a sore throat, such as:  An infection.  Allergies.  Dry air.  Smoke or pollution.  Radiation treatment.  Gastroesophageal reflux disease (GERD).  A tumor. A sore throat can be the first sign of another sickness. It can happen with other problems, like:  Coughing.  Sneezing.  Fever.  Swelling in the neck. Most sore throats go away without treatment. Follow these instructions at home:       Take over-the-counter medicines only as told by your doctor. ? If your child has a sore throat, do not give your child aspirin.  Drink enough fluids to keep your pee (urine) pale yellow.  Rest when you feel you need to.  To help with pain: ? Sip warm liquids, such as broth, herbal tea, or warm water. ? Eat or drink cold or frozen liquids, such as frozen ice pops. ? Gargle with a salt-water mixture 3-4 times a day or as needed. To make a salt-water mixture, add -1 tsp (3-6 g) of salt to 1 cup (237 mL) of warm water. Mix it until  you cannot see the salt anymore. ? Suck on hard candy or throat lozenges. ? Put a cool-mist humidifier in your bedroom at night. ? Sit in the bathroom with the door closed for 5-10 minutes while you run hot water in the shower.  Do not use any products that contain nicotine or tobacco, such as cigarettes, e-cigarettes, and chewing tobacco. If you need help quitting, ask your doctor.  Wash your hands well and often with soap and water. If soap and water are not available, use hand sanitizer. Contact a doctor if:  You have a fever for more than 2-3  days.  You keep having symptoms for more than 2-3 days.  Your throat does not get better in 7 days.  You have a fever and your symptoms suddenly get worse.  Your child who is 3 months to 45 years old has a temperature of 102.5F (39C) or higher. Get help right away if:  You have trouble breathing.  You cannot swallow fluids, soft foods, or your saliva.  You have swelling in your throat or neck that gets worse.  You keep feeling sick to your stomach (nauseous).  You keep throwing up (vomiting). Summary  A sore throat is pain, burning, irritation, or scratchiness in the throat. Many things can cause a sore throat.  Take over-the-counter medicines only as told by your doctor. Do not give your child aspirin.  Drink plenty of fluids, and rest as needed.  Contact a doctor if your symptoms get worse or your sore throat does not get better within 7 days. This information is not intended to replace advice given to you by your health care provider. Make sure you discuss any questions you have with your health care provider. Document Released: 06/15/2008 Document Revised: 02/06/2018 Document Reviewed: 02/06/2018 Elsevier Patient Education  2020 [employer].

## 2019-06-21 NOTE — Progress Notes (Signed)
   Subjective:    Patient ID: Randall Evans, male    DOB: 1976/08/03, 43 y.o.   MRN: [id number]  HPI Willi is here today with reports of sore throat x 4 days. He reports he initially had a sore throat, headache and fever which the other symptoms resolved but he continues to have a sore throat. He has been tested for covid and reports negative results received yesterday from 06/19/2019. He reports seasonal allergies and is taking Claritin. Reports no one else in home has similar symptoms. Denies any facial pain, ear pain. Reports eating well and adequate nutrition, hydrating well and sleeping well.   Review of Systems  Constitutional: Positive for fever.  HENT: Positive for sore throat. Negative for ear pain and sinus pain.   Respiratory: Negative for cough and shortness of breath.   Neurological: Positive for headaches.       Objective:   Physical Exam Constitutional:      General: He is not in acute distress.    Appearance: He is well-developed. He is not ill-appearing.  HENT:     Head: Normocephalic and atraumatic.     Comments: No frontal or maxillary sinus tenderness    Right Ear: Ear canal normal. No swelling or tenderness. Tympanic membrane is not erythematous.     Left Ear: Ear canal normal. No swelling or tenderness. A middle ear effusion is present. Tympanic membrane is not erythematous.     Ears:     Comments: Bilateral intact TM with no erythema and noted cloudy fluid and worse on right    Nose:     Comments: Rhinorrhea with clumpy yellow nasal discharge    Mouth/Throat:     Tonsils: Tonsillar exudate present. 3+ on the left.     Comments: Left tonsillar region 3+ with exudate and erythema Neck:     Musculoskeletal: Neck supple.     Comments: Bilateral upper anterior lymphadenopathy with no tenderness Cardiovascular:     Rate and Rhythm: Normal rate and regular rhythm.     Heart sounds: Normal heart sounds.  Pulmonary:     Effort: Pulmonary effort is normal.     Breath sounds: Normal breath sounds.  Abdominal:     General: Bowel sounds are normal.     Palpations: Abdomen is soft. There is no mass.     Tenderness: There is no abdominal tenderness.  Skin:    General: Skin is warm and dry.  Neurological:     General: No focal deficit present.     Mental Status: He is alert and oriented to person, place, and time.  Psychiatric:        Mood and Affect: Mood normal.           Assessment & Plan:

## 2019-07-11 ENCOUNTER — Other Ambulatory Visit: Payer: Self-pay

## 2019-07-11 ENCOUNTER — Ambulatory Visit: Payer: Self-pay

## 2019-07-11 DIAGNOSIS — Z23 Encounter for immunization: Secondary | ICD-10-CM

## 2019-11-25 ENCOUNTER — Ambulatory Visit: Payer: Self-pay | Attending: Internal Medicine

## 2019-11-25 DIAGNOSIS — Z23 Encounter for immunization: Secondary | ICD-10-CM | POA: Insufficient documentation

## 2019-11-25 NOTE — Progress Notes (Signed)
   Covid-19 Vaccination Clinic  Name:  Randall Evans    MRN: [id number] DOB: 04/27/1976  11/25/2019  [name] was observed post Covid-19 immunization for 15 minutes without incident. He was provided with Vaccine Information Sheet and instruction to access the V-Safe system.   [name] was instructed to call 911 with any severe reactions post vaccine: Marland Kitchen Difficulty breathing  . Swelling of face and throat  . A fast heartbeat  . A bad rash all over body  . Dizziness and weakness   Immunizations Administered    Name Date Dose VIS Date Route   Pfizer COVID-19 Vaccine 11/25/2019 11:48 AM 0.3 mL 08/31/2019 Intramuscular   Manufacturer: [place]   Lot: [id number]   [place]: [id number]

## 2019-12-19 ENCOUNTER — Ambulatory Visit: Payer: Self-pay | Attending: Internal Medicine

## 2019-12-19 DIAGNOSIS — Z23 Encounter for immunization: Secondary | ICD-10-CM

## 2019-12-19 NOTE — Progress Notes (Signed)
   Covid-19 Vaccination Clinic  Name:  SHASHANK BARRANCO    MRN: [id number] DOB: Jul 22, 1976  12/19/2019  Mr. Blaustein was observed post Covid-19 immunization for 15 minutes without incident. He was provided with Vaccine Information Sheet and instruction to access the V-Safe system.   Mr. Salk was instructed to call 911 with any severe reactions post vaccine: Marland Kitchen Difficulty breathing  . Swelling of face and throat  . A fast heartbeat  . A bad rash all over body  . Dizziness and weakness   Immunizations Administered    Name Date Dose VIS Date Route   Pfizer COVID-19 Vaccine 12/19/2019 11:37 AM 0.3 mL 08/31/2019 Intramuscular   Manufacturer: [place]   Lot: [id number]   [place]: [id number]

## 2020-02-06 ENCOUNTER — Ambulatory Visit: Payer: Self-pay | Admitting: Medical

## 2020-02-06 ENCOUNTER — Other Ambulatory Visit: Payer: Self-pay

## 2020-02-06 DIAGNOSIS — L600 Ingrowing nail: Secondary | ICD-10-CM

## 2020-02-06 MED ORDER — CEPHALEXIN 500 MG PO CAPS: 500.0000 mg | ORAL_CAPSULE | Freq: Four times a day (QID) | ORAL | 0 refills | Status: DC

## 2020-02-06 NOTE — Progress Notes (Signed)
Subjective:    Patient ID: Randall Evans, male    DOB: 21-Nov-1975, 44 y.o.   MRN: [id number]  HPI 44 yo male in non acute distress. Presents today for right great toe pain.  Started  2.5 weeks with infection, has been soaking it in epsom salt and using neosporin to the site.  Would like Tetanus vaccine since last vaccine was in 2010.    There were no vitals taken for this visit. No Known Allergies   Current Outpatient Medications:  .  amoxicillin (AMOXIL) 875 MG tablet, Take 1 tablet (875 mg total) by mouth 2 (two) times daily. X 10 days, Disp: 20 tablet, Rfl: 0 .  cephALEXin (KEFLEX) 500 MG capsule, Take 1 capsule (500 mg total) by mouth 4 (four) times daily., Disp: 40 capsule, Rfl: 0 .  loratadine (CLARITIN) 10 MG tablet, Take 10 mg by mouth daily., Disp: , Rfl:  .  oxymetazoline (AFRIN) 0.05 % nasal spray, Place 1 spray into both nostrils 2 (two) times daily., Disp: , Rfl:  .  Pseudoephedrine-APAP-DM (DAYQUIL PO), Take by mouth., Disp: , Rfl:  Past Medical History:  Diagnosis Date  . Heart murmur    Childhood. Resolved.  . Wears contact lenses     Past Surgical History:  Procedure Laterality Date  . COLONOSCOPY WITH PROPOFOL N/A 07/03/2018   Procedure: COLONOSCOPY WITH PROPOFOL;  Surgeon: [name], MD;  Location: [place];  Service: Endoscopy;  Laterality: N/A;  . MANDIBLE SURGERY    . 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  Gastrointestinal: Negative for diarrhea, nausea and vomiting.  Skin: Positive for color change (slightly red on the lateral side of right great toe.) and wound.  Hematological: Negative for adenopathy.       Objective:   Physical Exam Cardiovascular:     Pulses:          Dorsalis pedis pulses are 2+ on the right side.       Posterior tibial pulses are 2+ on the right side.  Musculoskeletal:       Feet:  Feet:     Right foot:     Skin integrity: Erythema  (lateral side of right great toe) present. No warmth.     Toenail Condition: Right toenails are ingrown.  Skin:    General: Skin is warm.     Capillary Refill: Capillary refill takes less than 2 seconds.     Findings: Wound (lateral  side of right great toe) present.   Tender to palpation, no discharge on exam. Mild erythema noted on the lateral side of great toe.          Assessment & Plan:  Right toe infection secondary to  Ingrown toe nail. Keflex for infection.   Meds ordered this encounter  Medications  . cephALEXin (KEFLEX) 500 MG capsule    Sig: Take 1 capsule (500 mg total) by mouth 4 (four) times daily.    Dispense:  40 capsule    Refill:  0   Tetanus (Tdap) updated. I dressed area for patient. Reviewed cleaning and bandaging of the right great  toe. Patient verbalizes understanding and has no questions at discharge. He is to return in 3-5 days if not improving or if worsening.   It was brought to my attention by  Nelda Severe RN that she gave a Td not Tdap. I notified my supervising MD. Dr. Alben Spittle  recommendation was if the patient  has another child ( he currently has an 67 month old and a 41 year old) that he would need get a Tdap ( so he would receive the Pertussis vaccine). I then discussed with the patient about what medication he actually received. Patient verbalizes understanding and has no questions at discharge.

## 2020-02-11 ENCOUNTER — Encounter: Payer: Self-pay | Admitting: Medical

## 2020-03-18 ENCOUNTER — Other Ambulatory Visit: Payer: Self-pay

## 2020-03-18 ENCOUNTER — Telehealth: Payer: Self-pay | Admitting: Nurse Practitioner

## 2020-03-18 ENCOUNTER — Encounter: Payer: Self-pay | Admitting: Nurse Practitioner

## 2020-03-18 DIAGNOSIS — J0100 Acute maxillary sinusitis, unspecified: Secondary | ICD-10-CM

## 2020-03-18 MED ORDER — AMOXICILLIN-POT CLAVULANATE 875-125 MG PO TABS: 1.0000 | ORAL_TABLET | Freq: Two times a day (BID) | ORAL | 0 refills | Status: DC

## 2020-03-18 NOTE — Progress Notes (Signed)
   Subjective:    Patient ID: Randall Evans, male    DOB: 1975/10/03, 44 y.o.   MRN: [id number]  HPI Randall Evans is on a virtual visit today with permission to treat. He reports that his entire family has had a cold that they got from their neighbor which they Covid tested negative. Randall Evans has been Covid vaccinated with his last dose of Pfizer 12-19-2019. Today he reports his symptoms started over 2 weeks ago and lessened but never went away and have since returned. He reports nasal congestion with yellow nasal discharge and pressure in his facial area and ear. He denies fever, SOB, persistent cough or wheezing. He's been taking Sudafed with some relief.    Review of Systems  Constitutional: Negative for fever.  HENT: Positive for congestion, postnasal drip and sinus pressure. Negative for sinus pain and sore throat.        + facial pressure and bilateral ear pressure  Respiratory: Negative for shortness of breath and wheezing.        Objective:   Physical Exam Constitutional:      General: He is not in acute distress.    Appearance: Normal appearance. He is not ill-appearing, toxic-appearing or diaphoretic.  HENT:     Head: Normocephalic and atraumatic.     Comments: Passive maxillary/frontal sinus palpation with some pressure over maxillary area but no tenderness to sinuses Pulmonary:     Effort: Pulmonary effort is normal.  Neurological:     Mental Status: He is alert and oriented to person, place, and time.  Psychiatric:        Mood and Affect: Mood normal.        Behavior: Behavior normal.           Assessment & Plan:

## 2020-03-18 NOTE — Patient Instructions (Signed)
Fluids and rest are encouraged Randall Evans Please start Flonase 1-2 sprays to each nostril daily depending on how severe your nasal congestion is Take over the counter Fexofenadine (Allegra) to help with the drainage as directed I've sent your antibiotic in to the pharmacy of your choice, please take with food Encouraged patient to call the office or primary care doctor for an appointment if no improvement in symptoms or if symptoms change or worsen after planned treatment. Patient verbalized understanding of all instructions given/reviewed and has no further questions or concerns at this time.

## 2020-04-16 ENCOUNTER — Encounter: Payer: Self-pay | Admitting: *Deleted

## 2020-05-02 ENCOUNTER — Ambulatory Visit: Payer: Self-pay | Admitting: Surgery

## 2020-05-05 ENCOUNTER — Other Ambulatory Visit: Payer: Self-pay

## 2020-05-05 ENCOUNTER — Ambulatory Visit: Payer: BC Managed Care – PPO | Admitting: Surgery

## 2020-05-05 ENCOUNTER — Encounter: Payer: Self-pay | Admitting: Surgery

## 2020-05-05 VITALS — BP 133/79 | HR 54 | Temp 98.2°F | Resp 12 | Ht 68.0 in | Wt 149.6 lb

## 2020-05-05 DIAGNOSIS — K648 Other hemorrhoids: Secondary | ICD-10-CM

## 2020-05-05 NOTE — Patient Instructions (Addendum)
We have placed a referral to Randall Evans (GI). Their office will call you within 7-10 days to schedule an appointment. If you do not hear from their office please give [place] a call back.   Follow up as needed with Randall Evans. Call the office if you have any questions or concerns.   Fiber Content in Foods  See the following list for the dietary fiber content of some common foods. High-fiber foods High-fiber foods contain 4 grams or more (4g or more) of fiber per serving. They include:  Artichoke (fresh) -- 1 medium has 10.3g of fiber.  Baked beans, plain or vegetarian (canned) --  cup has 5.2g of fiber.  Blackberries or raspberries (fresh) --  cup has 4g of fiber.  Bran cereal --  cup has 8.6g of fiber.  Bulgur (cooked) --  cup has 4g of fiber.  Kidney beans (canned) --  cup has 6.8g of fiber.  Lentils (cooked) --  cup has 7.8g of fiber.  Pear (fresh) -- 1 medium has 5.1g of fiber.  Peas (frozen) --  cup has 4.4g of fiber.  Pinto beans (canned) --  cup has 5.5g of fiber.  Pinto beans (dried and cooked) --  cup has 7.7g of fiber.  Potato with skin (baked) -- 1 medium has 4.4g of fiber.  Quinoa (cooked) --  cup has 5g of fiber.  Soybeans (canned, frozen, or fresh) --  cup has 5.1g of fiber. Moderate-fiber foods Moderate-fiber foods contain 1-4 grams (1-4g) of fiber per serving. They include:  Almonds -- 1 oz. has 3.5g of fiber.  Apple with skin -- 1 medium has 3.3g of fiber.  Applesauce, sweetened --  cup has 1.5g of fiber.  Bagel, plain -- one 4-inch (10-cm) bagel has 2g of fiber.  Banana -- 1 medium has 3.1g of fiber.  Broccoli (cooked) --  cup has 2.5g of fiber.  Carrots (cooked) --  cup has 2.3g of fiber.  Corn (canned or frozen) --  cup has 2.1g of fiber.  Corn tortilla -- one 6-inch (15-cm) tortilla has 1.5g of fiber.  Green beans (canned) --  cup has 2g of fiber.  Instant oatmeal --  cup has about 2g of fiber.  Long-grain  brown rice (cooked) -- 1 cup has 3.5g of fiber.  Macaroni, enriched (cooked) -- 1 cup has 2.5g of fiber.  Melon -- 1 cup has 1.4g of fiber.  Multigrain cereal --  cup has about 2-4g of fiber.  Orange -- 1 small has 3.1g of fiber.  Potatoes, mashed --  cup has 1.6g of fiber.  Raisins -- 1/4 cup has 1.6g of fiber.  Squash --  cup has 2.9g of fiber.  Sunflower seeds --  cup has 1.1g of fiber.  Tomato -- 1 medium has 1.5g of fiber.  Vegetable or soy patty -- 1 has 3.4g of fiber.  Whole-wheat bread -- 1 slice has 2g of fiber.  Whole-wheat spaghetti --  cup has 3.2g of fiber. Low-fiber foods Low-fiber foods contain less than 1 gram (less than 1g) of fiber per serving. They include:  Egg -- 1 large.  Flour tortilla -- one 6-inch (15-cm) tortilla.  Fruit juice --  cup.  Lettuce -- 1 cup.  Meat, poultry, or fish -- 1 oz.  Milk -- 1 cup.  Spinach (raw) -- 1 cup.  White bread -- 1 slice.  White rice --  cup.  Yogurt --  cup. Actual amounts of fiber in foods may be different depending on processing.  Talk with your dietitian about how much fiber you need in your diet. This information is not intended to replace advice given to you by your health care provider. Make sure you discuss any questions you have with your health care provider. Document Revised: 04/29/2016 Document Reviewed: 10/30/2015 Elsevier Patient Education  [place].

## 2020-05-05 NOTE — Progress Notes (Signed)
05/05/2020  Reason for Visit:  Internal hemorrhoids  Referring Provider:  [facility], [place]  History of Present Illness: Randall Evans is a 44 y.o. male presenting for evaluation of bleeding internal hemorrhoids.  The patient had a colonoscopy with Dr. Allen Norris on 07/03/18 for rectal bleeding, which did not find any pathology except for non-bleeding internal hemorrhoids, grade 2.  At that point, he was having only a few episodes of bleeding in a year, but over the past 6 months, they have become more frequent.  He has bleeding with bowel movements, and he may also notice blood in his underwear.  Reports it is bright red blood.  Denies any profuse bleeding.  Overall, he has been following the appropriate steps for patients with bleeding hemorrhoids.  He has increased his fiber intake and has not had significant constipation, has been changing his bowel habits so he's not sitting on the toilet for long, has been using over the counter medications to help with inflammation, and has been doing Sitz baths.  However, he feels that despite of this, there's only been worsening rather than improvement.  Past Medical History: Past Medical History:  Diagnosis Date  . Heart murmur    Childhood. Resolved.  . Wears contact lenses      Past Surgical History: Past Surgical History:  Procedure Laterality Date  . COLONOSCOPY WITH PROPOFOL N/A 07/03/2018   Procedure: COLONOSCOPY WITH PROPOFOL;  Surgeon: [name], MD;  Location: [place];  Service: Endoscopy;  Laterality: N/A;  . MANDIBLE SURGERY    . MELANOMA EXCISION  2011   chest    Home Medications: Prior to Admission medications   Not on File    Allergies: No Known Allergies  Social History:  reports that he has never smoked. He has never used smokeless tobacco. He reports current alcohol use of about 3.0 standard drinks of alcohol per week. He reports that he does not use drugs.   Family History: History  reviewed. No pertinent family history.  Review of Systems: Review of Systems  Constitutional: Negative for chills and fever.  HENT: Negative for hearing loss.   Respiratory: Negative for shortness of breath.   Cardiovascular: Negative for chest pain.  Gastrointestinal: Positive for blood in stool. Negative for abdominal pain, constipation, diarrhea, nausea and vomiting.  Genitourinary: Negative for dysuria.  Musculoskeletal: Negative for myalgias.  Skin: Negative for rash.  Neurological: Negative for dizziness.  Psychiatric/Behavioral: Negative for depression.    Physical Exam BP 133/79   Pulse (!) 54   Temp 98.2 F (36.8 C)   Resp 12   Ht 5\' 8"  (1.727 m)   Wt 149 lb 9.6 oz (67.9 kg)   SpO2 100%   BMI 22.75 kg/m  CONSTITUTIONAL: No acute distress HEENT:  Normocephalic, atraumatic, extraocular motion intact. NECK: Trachea is midline, and there is no jugular venous distension.  RESPIRATORY:  Lungs are clear, and breath sounds are equal bilaterally. Normal respiratory effort without pathologic use of accessory muscles. CARDIOVASCULAR: Heart is regular without murmurs, gallops, or rubs. GI: The abdomen is soft, non-distended, non-tender.  RECTAL:  External exam reveals no enlarged external hemorrhoids, fissures, or fistula.  Digital rectal exam reveals enlarged right anterior and posterior hemorrhoidal columns internally only.  No active bleeding and no gross blood on glove. MUSCULOSKELETAL:  Normal muscle strength and tone in all four extremities.  No peripheral edema or cyanosis. SKIN: Skin turgor is normal. There are no pathologic skin lesions.  NEUROLOGIC:  Motor and sensation  is grossly normal.  Cranial nerves are grossly intact. PSYCH:  Alert and oriented to person, place and time. Affect is normal.  Laboratory Analysis: No results found for this or any previous visit (from the past 24 hour(s)).  Imaging: No results found.  Assessment and Plan: This is a 44 y.o. male  with history of bleeding internal hemorrhoids.  --Discussed with the patient that the external components are normal without any enlargement, skin tags, or other complication.  It appears that it is strictly an internal hemorrhoid issue.  For that, discussed with him that we could refer him to GI for evaluation of internal hemorrhoid banding.  If the size is too large or he recurs after banding, then we can proceed with surgical excision in the OR, but I think for now banding could be a good first step, and possibly the definitive step. --Recommend that he continue his current regimen with fiber, fluids, changing bowel habits, and sitz baths.  He can also add stool softener or MiraLax to help with constipation, but also should avoid diarrhea. --Follow up as needed.  Face-to-face time spent with the patient and care providers was 40 minutes, with more than 50% of the time spent counseling, educating, and coordinating care of the patient.     Melvyn Neth, [facility]

## 2020-06-27 ENCOUNTER — Encounter: Payer: Self-pay | Admitting: Gastroenterology

## 2020-06-27 ENCOUNTER — Ambulatory Visit: Payer: BC Managed Care – PPO | Admitting: Gastroenterology

## 2020-06-27 ENCOUNTER — Other Ambulatory Visit: Payer: Self-pay

## 2020-06-27 VITALS — BP 129/56 | HR 59 | Temp 97.5°F | Wt 146.0 lb

## 2020-06-27 DIAGNOSIS — K640 First degree hemorrhoids: Secondary | ICD-10-CM | POA: Diagnosis not present

## 2020-06-27 DIAGNOSIS — K625 Hemorrhage of anus and rectum: Secondary | ICD-10-CM | POA: Diagnosis not present

## 2020-06-27 NOTE — Progress Notes (Signed)
Cephas Darby, MD [address]  [place]  [place], [place] [address]  Main: [phone]  Fax: [phone]    Gastroenterology Consultation  Referring Provider:     Olean Ree, MD Primary Care Physician:  Talmage Nap, PA-C Primary Gastroenterologist:  Dr. Cephas Darby Reason for Consultation:     Painless rectal bleeding        HPI:   Randall Evans is a 44 y.o. male referred by Dr. Talmage Nap, PA-C  for consultation & management of painless rectal bleeding.  Patient reports that approximately 2 years history of bright red blood per rectum, dripping in the toilet bowl as well as on wiping.  He underwent colonoscopy in 2019 for this complaint, found to have internal hemorrhoids only.  He was recently evaluated by Dr. Hampton Abbot for hemorrhoidectomy.  He was advised to try hemorrhoid ligation before undergoing surgery and therefore referred to me.  Patient denies any constipation.  Ever since he saw Dr. Hampton Abbot about 2 months ago, he said he has tried to relax more during difficult to reprocess which has helped to minimize the bleeding.  He does see blood on wiping.  He feels like hemorrhoid is protruding within the anal canal but not completely outside.  He is still interested to undergo hemorrhoid ligation  He does not smoke or drink alcohol He denies family history of GI malignancy  NSAIDs: None  Antiplts/Anticoagulants/Anti thrombotics: None  GI Procedures:  Colonoscopy 07/03/2018 - Non-bleeding internal hemorrhoids. - No specimens collected.  Past Medical History:  Diagnosis Date  . Heart murmur    Childhood. Resolved.  . Wears contact lenses     Past Surgical History:  Procedure Laterality Date  . COLONOSCOPY WITH PROPOFOL N/A 07/03/2018   Procedure: COLONOSCOPY WITH PROPOFOL;  Surgeon: [name], MD;  Location: [facility];  Service: Endoscopy;  Laterality: N/A;  . MANDIBLE SURGERY    . MELANOMA EXCISION  2011   chest    No current outpatient medications on file.   History reviewed. No pertinent family history.   Social History   Tobacco Use  . Smoking status: Never Smoker  . Smokeless tobacco: Never Used  Vaping Use  . Vaping Use: Never used  Substance Use Topics  . Alcohol use: Yes    Alcohol/week: 3.0 standard drinks    Types: 3 Cans of beer per week  . Drug use: No    Allergies as of 06/27/2020  . (No Known Allergies)    Review of Systems:    All systems reviewed and negative except where noted in HPI.   Physical Exam:  BP (!) 129/56 (BP Location: Left Arm, Patient Position: Sitting, Cuff Size: Normal)   Pulse (!) 59   Temp (!) 97.5 F (36.4 C) (Oral)   Wt 146 lb (66.2 kg)   BMI 22.20 kg/m  No LMP for male patient.  General:   Alert,  Well-developed, well-nourished, pleasant and cooperative in NAD Head:  Normocephalic and atraumatic. Eyes:  Sclera clear, no icterus.   Conjunctiva pink. Ears:  Normal auditory acuity. Nose:  No deformity, discharge, or lesions. Mouth:  No deformity or lesions,oropharynx pink & moist. Neck:  Supple; no masses or thyromegaly. Lungs:  Respirations even and unlabored.  Clear throughout to auscultation.   No wheezes, crackles, or rhonchi. No acute distress. Heart:  Regular rate and rhythm; no murmurs, clicks, rubs, or gallops. Abdomen:  Normal bowel sounds. Soft, non-tender and non-distended without masses, hepatosplenomegaly or hernias  noted.  No guarding or rebound tenderness.   Rectal: Normal perianal exam, palpable external hemorrhoids and right posterior location, nontender digital rectal exam Msk:  Symmetrical without gross deformities. Good, equal movement & strength bilaterally. Pulses:  Normal pulses noted. Extremities:  No clubbing or edema.  No cyanosis. Neurologic:  Alert and oriented x3;  grossly normal neurologically. Skin:  Intact without significant lesions or rashes. No jaundice. Psych:  Alert and cooperative. Normal mood and  affect.  Imaging Studies: None  Assessment and Plan:   Randall Evans is a 44 y.o. male with no significant past medical history is seen in consultation for painless rectal bleeding secondary to internal hemorrhoids  I have discussed about hemorrhoid ligation including risks and benefits, consent obtained Perform hemorrhoid ligation today   Follow up in 2weeks   Cephas Darby, MD

## 2020-06-27 NOTE — Progress Notes (Signed)
PROCEDURE NOTE: The patient presents with symptomatic grade 1 hemorrhoids, unresponsive to maximal medical therapy, requesting rubber band ligation of his/her hemorrhoidal disease.  All risks, benefits and alternative forms of therapy were described and informed consent was obtained.  In the Left Lateral Decubitus position (if anoscopy is performed) anoscopic examination revealed grade 2 hemorrhoids in the RP position(s).   The decision was made to band the RP internal hemorrhoid, and the Dike was used to perform band ligation without complication.  Digital anorectal examination was then performed to assure proper positioning of the band, and to adjust the banded tissue as required.  The patient was discharged home without pain or other issues.  Dietary and behavioral recommendations were given and (if necessary - prescriptions were given), along with follow-up instructions.  The patient will return 2 weeks for follow-up and possible additional banding as required.  No complications were encountered and the patient tolerated the procedure well.

## 2020-07-10 ENCOUNTER — Encounter: Payer: Self-pay | Admitting: Gastroenterology

## 2020-07-10 ENCOUNTER — Telehealth: Payer: BC Managed Care – PPO | Admitting: Nurse Practitioner

## 2020-07-10 ENCOUNTER — Ambulatory Visit: Payer: BC Managed Care – PPO | Admitting: Gastroenterology

## 2020-07-10 ENCOUNTER — Ambulatory Visit: Payer: BC Managed Care – PPO

## 2020-07-10 ENCOUNTER — Other Ambulatory Visit: Payer: Self-pay

## 2020-07-10 VITALS — BP 130/86 | HR 55 | Temp 97.7°F | Wt 152.1 lb

## 2020-07-10 DIAGNOSIS — K640 First degree hemorrhoids: Secondary | ICD-10-CM | POA: Diagnosis not present

## 2020-07-10 DIAGNOSIS — K625 Hemorrhage of anus and rectum: Secondary | ICD-10-CM | POA: Diagnosis not present

## 2020-07-10 DIAGNOSIS — Z20822 Contact with and (suspected) exposure to covid-19: Secondary | ICD-10-CM

## 2020-07-10 LAB — POC COVID19 BINAXNOW: SARS Coronavirus 2 Ag: NEGATIVE

## 2020-07-10 MED ORDER — AZITHROMYCIN 250 MG PO TABS: ORAL_TABLET | ORAL | 0 refills | Status: DC

## 2020-07-10 NOTE — Progress Notes (Signed)

## 2020-07-10 NOTE — Progress Notes (Signed)
   Subjective:    Patient ID: Randall Evans, male    DOB: 07/08/1976, 44 y.o.   MRN: [id number]  HPI  44 year old male, 42 month old daughter currently on abx for URI symptoms/ear throat. Daughter had negative RSV/COVID respiratory panel was not tested for strep that he knows of. Patient started to have symptoms two days ago with sore throat that is worse when speaking and eating. No fever no congestion or cough at this time. Does feel pressure behind his ears. Symptoms improve with ibuprofen. He has been vaccinated for COVID-19.    Review of Systems  Constitutional: Negative.   HENT: Positive for sore throat.   Respiratory: Negative.   Cardiovascular: Negative.    No significant medical history, takes no prescription medications, NKDA    Objective:   Physical Exam  This was a telehealth visit after patient had Rapid COVID test with RN  Recent Results (from the past 2160 hour(s))  POC COVID-19     Status: Normal   Collection Time: 07/10/20  9:32 AM  Result Value Ref Range   SARS Coronavirus 2 Ag Negative Negative    Comment: Vaccinated, mildly symptomatic patient aware of negative POC result. Has telephone appointment with S.Kai Calico FNP for determinatio of PCR and further instructions.        Assessment & Plan:  Since patient has been exposed to ill child without confirmation of diagnosis aside form negative viral testing will cover with abx for strep based on CENTOR criteria, ST without congestion or cough.   Advised to continue ibuprofen for pain relief, assure hydration and rest. RTC if symptoms persist or change as discussed.  Meds ordered this encounter  Medications  . azithromycin (ZITHROMAX) 250 MG tablet    Sig: Take 2 tablets on day one. Then take one tablet daily on days 2-5. Take with food    Dispense:  6 tablet    Refill:  0

## 2020-07-25 ENCOUNTER — Encounter: Payer: Self-pay | Admitting: Gastroenterology

## 2020-07-25 ENCOUNTER — Ambulatory Visit: Payer: BC Managed Care – PPO | Admitting: Gastroenterology

## 2020-07-25 ENCOUNTER — Other Ambulatory Visit: Payer: Self-pay

## 2020-07-25 VITALS — BP 120/72 | HR 50 | Temp 97.3°F | Wt 150.1 lb

## 2020-07-25 DIAGNOSIS — K625 Hemorrhage of anus and rectum: Secondary | ICD-10-CM

## 2020-07-25 DIAGNOSIS — K640 First degree hemorrhoids: Secondary | ICD-10-CM

## 2020-07-25 NOTE — Progress Notes (Signed)

## 2020-09-03 ENCOUNTER — Telehealth: Payer: Self-pay | Admitting: Gastroenterology

## 2020-09-03 NOTE — Telephone Encounter (Signed)
Patient said he had cataract surgery and thinks he got dehydrated. He states the last couple of days has had rectal bleeding.  Made him a appointment for 09/04/2020 in [place]

## 2020-09-03 NOTE — Telephone Encounter (Signed)
Pt calling to schedule banding, please call to sch

## 2020-09-04 ENCOUNTER — Ambulatory Visit (INDEPENDENT_AMBULATORY_CARE_PROVIDER_SITE_OTHER): Payer: BC Managed Care – PPO | Admitting: Gastroenterology

## 2020-09-04 ENCOUNTER — Other Ambulatory Visit: Payer: Self-pay

## 2020-09-04 ENCOUNTER — Encounter: Payer: Self-pay | Admitting: Gastroenterology

## 2020-09-04 VITALS — BP 134/74 | HR 56 | Temp 97.5°F | Wt 151.5 lb

## 2020-09-04 DIAGNOSIS — K640 First degree hemorrhoids: Secondary | ICD-10-CM

## 2020-09-04 DIAGNOSIS — K625 Hemorrhage of anus and rectum: Secondary | ICD-10-CM

## 2020-09-04 NOTE — Progress Notes (Signed)
Randall Darby, MD [address]  [place]  [place], [place] [address]  Main: [phone]  Fax: [phone]    Gastroenterology Consultation  Referring Provider:     No ref. provider found Primary Care Physician:  Randall Nap, PA-C Primary Gastroenterologist:  Dr. Cephas Evans Reason for Consultation:     Painless rectal bleeding        HPI:   Randall Evans is a 44 y.o. male referred by Dr. Talmage Nap, PA-C  for consultation & management of painless rectal bleeding.  Patient reports that approximately 2 years history of bright red blood per rectum, dripping in the toilet bowl as well as on wiping.  He underwent colonoscopy in 2019 for this complaint, found to have internal hemorrhoids only.  He was recently evaluated by Dr. Hampton Evans for hemorrhoidectomy.  He was advised to try hemorrhoid ligation before undergoing surgery and therefore referred to me.  Patient denies any constipation.  Ever since he saw Dr. Hampton Evans about 2 months ago, he said he has tried to relax more during difficult to reprocess which has helped to minimize the bleeding.  He does see blood on wiping.  He feels like hemorrhoid is protruding within the anal canal but not completely outside.  He is still interested to undergo hemorrhoid ligation  Follow-up visit 09/04/2020 Patient underwent hemorrhoid ligation of all 3 hemorrhoids for painless rectal bleeding. He reports he was doing well until earlier this week when he underwent cataract surgery on Monday. He was on clear liquid diet and he noticed painless rectal bleeding, bright red blood on wiping and in the toilet bowel on the day of surgery. Post surgery, he had hard bowel movement and followed by that, about 45 minutes later he noticed leakage and found to have stain his underwear and bedsheet with bright red blood. He is here to discuss if he needs fourth ligation. He did not have any further episodes of rectal bleeding since then. His  bowel movements are back to normal  He does not smoke or drink alcohol He denies family history of GI malignancy  NSAIDs: None  Antiplts/Anticoagulants/Anti thrombotics: None  GI Procedures:  Colonoscopy 07/03/2018 - Non-bleeding internal hemorrhoids. - No specimens collected.  Past Medical History:  Diagnosis Date  . Heart murmur    Childhood. Resolved.  . Wears contact lenses     Past Surgical History:  Procedure Laterality Date  . COLONOSCOPY WITH PROPOFOL N/A 07/03/2018   Procedure: COLONOSCOPY WITH PROPOFOL;  Surgeon: Randall Lame, MD;  Location: [facility];  Service: Endoscopy;  Laterality: N/A;  . MANDIBLE SURGERY    . MELANOMA EXCISION  2011   chest   No current outpatient medications on file.   History reviewed. No pertinent family history.   Social History   Tobacco Use  . Smoking status: Never Smoker  . Smokeless tobacco: Never Used  Vaping Use  . Vaping Use: Never used  Substance Use Topics  . Alcohol use: Yes    Alcohol/week: 3.0 standard drinks    Types: 3 Cans of beer per week  . Drug use: No    Allergies as of 09/04/2020  . (No Known Allergies)    Review of Systems:    All systems reviewed and negative except where noted in HPI.   Physical Exam:  BP 134/74 (BP Location: Left Arm, Patient Position: Sitting, Cuff Size: Normal)   Pulse (!) 56   Temp (!) 97.5 F (36.4 C) (Oral)  Wt 151 lb 8 oz (68.7 kg)   BMI 23.04 kg/m  No LMP for male patient.  General:   Alert,  Well-developed, well-nourished, pleasant and cooperative in NAD Head:  Normocephalic and atraumatic. Eyes:  Sclera clear, no icterus.   Conjunctiva pink. Ears:  Normal auditory acuity. Nose:  No deformity, discharge, or lesions. Mouth:  No deformity or lesions,oropharynx pink & moist. Neck:  Supple; no masses or thyromegaly. Lungs:  Respirations even and unlabored.  Clear throughout to auscultation.   No wheezes, crackles, or rhonchi. No acute distress. Heart:   Regular rate and rhythm; no murmurs, clicks, rubs, or gallops. Abdomen:  Normal bowel sounds. Soft, non-tender and non-distended without masses, hepatosplenomegaly or hernias noted.  No guarding or rebound tenderness.   Rectal: Not performed Msk:  Symmetrical without gross deformities. Good, equal movement & strength bilaterally. Pulses:  Normal pulses noted. Extremities:  No clubbing or edema.  No cyanosis. Neurologic:  Alert and oriented x3;  grossly normal neurologically. Skin:  Intact without significant lesions or rashes. No jaundice. Psych:  Alert and cooperative. Normal mood and affect.  Imaging Studies: None  Assessment and Plan:   Randall Evans is a 44 y.o. male with no significant past medical history is seen in for follow-up of recurrence of  painless rectal bleeding s/p rubber band ligation of the RA, RP, LL  internal hemorrhoids  Recurrence of painless rectal bleeding Likely secondary to hemorrhoids in setting of stress from recent surgery. If bleeding recurs, recommend sigmoidoscopy to evaluate for any polypoid lesions before pursuing for fourth banding   Follow up as needed, via MyChart   Randall Darby, MD

## 2020-09-20 HISTORY — PX: VASECTOMY: SHX75

## 2020-10-02 ENCOUNTER — Ambulatory Visit: Payer: BC Managed Care – PPO | Admitting: Nurse Practitioner

## 2020-10-02 ENCOUNTER — Other Ambulatory Visit: Payer: Self-pay

## 2020-10-02 DIAGNOSIS — Z20822 Contact with and (suspected) exposure to covid-19: Secondary | ICD-10-CM

## 2020-10-02 NOTE — Progress Notes (Signed)
Negative test at symptomatic testing on campus yesterday called and requested PCR testing without provider visit  Will f/u with results when available

## 2020-10-04 ENCOUNTER — Encounter: Payer: Self-pay | Admitting: Nurse Practitioner

## 2020-10-04 LAB — NOVEL CORONAVIRUS, NAA: SARS-CoV-2, NAA: NOT DETECTED

## 2020-10-04 LAB — SARS-COV-2, NAA 2 DAY TAT

## 2020-10-22 ENCOUNTER — Encounter: Payer: Self-pay | Admitting: Gastroenterology

## 2020-10-27 ENCOUNTER — Other Ambulatory Visit: Payer: Self-pay

## 2020-10-27 DIAGNOSIS — K625 Hemorrhage of anus and rectum: Secondary | ICD-10-CM

## 2020-10-27 NOTE — Telephone Encounter (Signed)
Scheduled patient for 11/03/2020 with Dr. Marius Ditch

## 2020-10-30 ENCOUNTER — Other Ambulatory Visit
Admission: RE | Admit: 2020-10-30 | Discharge: 2020-10-30 | Disposition: A | Discharge status: Home / Self Care | Payer: BC Managed Care – PPO | Source: Ambulatory Visit | Attending: Gastroenterology | Admitting: Gastroenterology

## 2020-10-30 ENCOUNTER — Other Ambulatory Visit: Payer: Self-pay

## 2020-10-30 DIAGNOSIS — Z20822 Contact with and (suspected) exposure to covid-19: Secondary | ICD-10-CM | POA: Insufficient documentation

## 2020-10-30 DIAGNOSIS — Z01812 Encounter for preprocedural laboratory examination: Secondary | ICD-10-CM | POA: Diagnosis present

## 2020-10-30 LAB — SARS CORONAVIRUS 2 (TAT 6-24 HRS): SARS Coronavirus 2: NEGATIVE

## 2020-11-03 ENCOUNTER — Encounter: Payer: Self-pay | Admitting: Gastroenterology

## 2020-11-03 ENCOUNTER — Ambulatory Visit: Payer: BC Managed Care – PPO | Admitting: Anesthesiology

## 2020-11-03 ENCOUNTER — Encounter
Admission: RE | Disposition: A | Discharge status: Home / Self Care | Payer: Self-pay | Source: Home / Self Care | Attending: Gastroenterology

## 2020-11-03 ENCOUNTER — Ambulatory Visit
Admission: RE | Admit: 2020-11-03 | Discharge: 2020-11-03 | Disposition: A | Discharge status: Home / Self Care | Payer: BC Managed Care – PPO | Attending: Gastroenterology | Admitting: Gastroenterology

## 2020-11-03 DIAGNOSIS — K644 Residual hemorrhoidal skin tags: Secondary | ICD-10-CM | POA: Insufficient documentation

## 2020-11-03 DIAGNOSIS — K648 Other hemorrhoids: Secondary | ICD-10-CM | POA: Diagnosis not present

## 2020-11-03 DIAGNOSIS — K625 Hemorrhage of anus and rectum: Secondary | ICD-10-CM | POA: Insufficient documentation

## 2020-11-03 DIAGNOSIS — Z85820 Personal history of malignant melanoma of skin: Secondary | ICD-10-CM | POA: Diagnosis not present

## 2020-11-03 HISTORY — PX: FLEXIBLE SIGMOIDOSCOPY: SHX5431

## 2020-11-03 SURGERY — SIGMOIDOSCOPY, FLEXIBLE
Anesthesia: General

## 2020-11-03 MED ORDER — PROPOFOL 500 MG/50ML IV EMUL
INTRAVENOUS | Status: AC
  Filled 2020-11-03: qty 50

## 2020-11-03 MED ORDER — SODIUM CHLORIDE 0.9 % IV SOLN
INTRAVENOUS | Status: DC
  Administered 2020-11-03: 1000 mL via INTRAVENOUS

## 2020-11-03 MED ORDER — PROPOFOL 500 MG/50ML IV EMUL
INTRAVENOUS | Status: DC | PRN
  Administered 2020-11-03: 150 ug/kg/min via INTRAVENOUS

## 2020-11-03 MED ORDER — LIDOCAINE HCL (CARDIAC) PF 100 MG/5ML IV SOSY
PREFILLED_SYRINGE | INTRAVENOUS | Status: DC | PRN
  Administered 2020-11-03: 50 mg via INTRAVENOUS

## 2020-11-03 MED ORDER — PROPOFOL 10 MG/ML IV BOLUS
INTRAVENOUS | Status: DC | PRN
  Administered 2020-11-03: 40 mg via INTRAVENOUS
  Administered 2020-11-03: 20 mg via INTRAVENOUS
  Administered 2020-11-03: 80 mg via INTRAVENOUS

## 2020-11-03 NOTE — Anesthesia Procedure Notes (Signed)
Date/Time: 11/03/2020 11:25 AM Performed by: Johnna Acosta, CRNA Pre-anesthesia Checklist: Patient identified, Emergency Drugs available, Suction available, Patient being monitored and Timeout performed Patient Re-evaluated:Patient Re-evaluated prior to induction Oxygen Delivery Method: Nasal cannula Preoxygenation: Pre-oxygenation with 100% oxygen Induction Type: IV induction

## 2020-11-03 NOTE — Anesthesia Postprocedure Evaluation (Signed)
Anesthesia Post Note  Patient: Randall Evans  Procedure(s) Performed: FLEXIBLE SIGMOIDOSCOPY (N/A )  Patient location during evaluation: Endoscopy Anesthesia Type: General Level of consciousness: awake and alert Pain management: pain level controlled Vital Signs Assessment: post-procedure vital signs reviewed and stable Respiratory status: spontaneous breathing, nonlabored ventilation, respiratory function stable and patient connected to nasal cannula oxygen Cardiovascular status: blood pressure returned to baseline and stable Postop Assessment: no apparent nausea or vomiting Anesthetic complications: no   No complications documented.   Last Vitals:  Vitals:   11/03/20 1215 11/03/20 1225  BP: 116/81 115/73  Pulse: (!) 56 (!) 51  Resp: (!) 22 20  Temp:    SpO2: 100% 100%    Last Pain:  Vitals:   11/03/20 1205  TempSrc:   PainSc: 0-No pain                 Arita Miss

## 2020-11-03 NOTE — Op Note (Signed)
[facility] Gastroenterology Patient Name: Randall Evans Procedure Date: 11/03/2020 11:18 AM MRN: [id number] Account #: [id number] Date of Birth: 11-11-1975 Admit Type: Outpatient Age: 45 Room: [facility] ENDO ROOM 1 Gender: Male Note Status: Finalized Procedure:             Flexible Sigmoidoscopy Indications:           Rectal hemorrhage Providers:             Lin Landsman MD, MD Referring MD:          Estill Dooms. Lily Peer, MD (Referring MD) Medicines:             General Anesthesia Complications:         No immediate complications. Estimated blood loss: None. Procedure:             Pre-Anesthesia Assessment:                        - Prior to the procedure, a History and Physical was                         performed, and patient medications and allergies were                         reviewed. The patient is competent. The risks and                         benefits of the procedure and the sedation options and                         risks were discussed with the patient. All questions                         were answered and informed consent was obtained.                         Patient identification and proposed procedure were                         verified by the physician, the nurse, the                         anesthesiologist, the anesthetist and the technician                         in the pre-procedure area in the procedure room in the                         endoscopy suite. Mental Status Examination: alert and                         oriented. Airway Examination: normal oropharyngeal                         airway and neck mobility. Respiratory Examination:                         clear to auscultation. CV Examination: normal.  Prophylactic Antibiotics: The patient does not require                         prophylactic antibiotics. Prior Anticoagulants: The                         patient has taken no previous anticoagulant  or                         antiplatelet agents. ASA Grade Assessment: II - A                         patient with mild systemic disease. After reviewing                         the risks and benefits, the patient was deemed in                         satisfactory condition to undergo the procedure. The                         anesthesia plan was to use general anesthesia.                         Immediately prior to administration of medications,                         the patient was re-assessed for adequacy to receive                         sedatives. The heart rate, respiratory rate, oxygen                         saturations, blood pressure, adequacy of pulmonary                         ventilation, and response to care were monitored                         throughout the procedure. The physical status of the                         patient was re-assessed after the procedure.                        After obtaining informed consent, the scope was passed                         under direct vision. The Colonoscope was introduced                         through the anus and advanced to the the descending                         colon. The flexible sigmoidoscopy was accomplished                         without difficulty. The patient tolerated the  procedure well. The quality of the bowel preparation                         was adequate. Findings:      The perianal and digital rectal examinations were normal. Pertinent       negatives include normal sphincter tone and no palpable rectal lesions.      Non-bleeding external internal hemorrhoids were found during       retroflexion. The hemorrhoids were medium-sized.      The exam was otherwise without abnormality. Impression:            - Non-bleeding external internal hemorrhoids.                        - The examination was otherwise normal.                        - No specimens collected. Recommendation:         - Discharge patient to home (with escort).                        - Resume regular diet. Procedure Code(s):     --- Professional ---                        [phone], Sigmoidoscopy, flexible; diagnostic, including                         collection of specimen(s) by brushing or washing, when                         performed (separate procedure) Diagnosis Code(s):     --- Professional ---                        K64.4, Residual hemorrhoidal skin tags                        K62.5, Hemorrhage of anus and rectum                        K64.8, Other hemorrhoids CPT copyright 2019 American Medical Association. All rights reserved. The codes documented in this report are preliminary and upon coder review may  be revised to meet current compliance requirements. Dr. Ulyess Mort Lin Landsman MD, MD 11/03/2020 11:41:11 AM This report has been signed electronically. Number of Addenda: 0 Note Initiated On: 11/03/2020 11:18 AM Scope Withdrawal Time: 0 hours 0 minutes 3 seconds  Total Procedure Duration: 0 hours 6 minutes 49 seconds  Estimated Blood Loss:  Estimated blood loss: none.      [facility]

## 2020-11-03 NOTE — H&P (Signed)
  Cephas Darby, MD [address]  [place]  [place], [place] [address]  Main: [phone]  Fax: [phone] Pager: [phone]  Primary Care Physician:  Talmage Nap, PA-C Primary Gastroenterologist:  Dr. Cephas Darby  Pre-Procedure History & Physical: HPI:  Randall Evans is a 45 y.o. male is here for an flexible sigmoidoscopy.   Past Medical History:  Diagnosis Date  . Heart murmur    Childhood. Resolved.  . Wears contact lenses     Past Surgical History:  Procedure Laterality Date  . COLONOSCOPY WITH PROPOFOL N/A 07/03/2018   Procedure: COLONOSCOPY WITH PROPOFOL;  Surgeon: [name], MD;  Location: [facility];  Service: Endoscopy;  Laterality: N/A;  . MANDIBLE SURGERY    . MELANOMA EXCISION  2011   chest    Prior to Admission medications   Not on File    Allergies as of 10/27/2020  . (No Known Allergies)    History reviewed. No pertinent family history.  Social History   Socioeconomic History  . Marital status: Married    Spouse name: Not on file  . Number of children: Not on file  . Years of education: Not on file  . Highest education level: Not on file  Occupational History  . Not on file  Tobacco Use  . Smoking status: Never Smoker  . Smokeless tobacco: Never Used  Vaping Use  . Vaping Use: Never used  Substance and Sexual Activity  . Alcohol use: Yes    Alcohol/week: 3.0 standard drinks    Types: 3 Cans of beer per week  . Drug use: No  . Sexual activity: Not on file  Other Topics Concern  . Not on file  Social History Narrative  . Not on file   Social Determinants of Health   Financial Resource Strain: Not on file  Food Insecurity: Not on file  Transportation Needs: Not on file  Physical Activity: Not on file  Stress: Not on file  Social Connections: Not on file  Intimate Partner Violence: Not on file    Review of Systems: See HPI, otherwise negative ROS  Physical Exam: BP 132/78   Pulse 62    Temp (!) 97.4 F (36.3 C) (Temporal)   Resp 18   Ht 5\' 8"  (1.727 m)   Wt 68.2 kg   SpO2 100%   BMI 22.85 kg/m  General:   Alert,  pleasant and cooperative in NAD Head:  Normocephalic and atraumatic. Neck:  Supple; no masses or thyromegaly. Lungs:  Clear throughout to auscultation.    Heart:  Regular rate and rhythm. Abdomen:  Soft, nontender and nondistended. Normal bowel sounds, without guarding, and without rebound.   Neurologic:  Alert and  oriented x4;  grossly normal neurologically.  Impression/Plan: Randall Evans is here for an flexible sigmoidoscopy to be performed for rectal bleeding  Risks, benefits, limitations, and alternatives regarding  flexible sigmoidoscopy have been reviewed with the patient.  Questions have been answered.  All parties agreeable.   Sherri Sear, MD  11/03/2020, 11:08 AM

## 2020-11-03 NOTE — Transfer of Care (Signed)
Immediate Anesthesia Transfer of Care Note  Patient: Randall Evans  Procedure(s) Performed: FLEXIBLE SIGMOIDOSCOPY (N/A )  Patient Location: PACU  Anesthesia Type:General  Level of Consciousness: awake, alert  and oriented  Airway & Oxygen Therapy: Patient Spontanous Breathing  Post-op Assessment: Report given to RN and Post -op Vital signs reviewed and stable  Post vital signs: Reviewed and stable  Last Vitals:  Vitals Value Taken Time  BP    Temp    Pulse    Resp    SpO2      Last Pain:  Vitals:   11/03/20 1007  TempSrc: Temporal  PainSc: 0-No pain         Complications: No complications documented.

## 2020-11-03 NOTE — Anesthesia Preprocedure Evaluation (Signed)
Anesthesia Evaluation  Patient identified by MRN, date of birth, ID band Patient awake    Reviewed: Allergy & Precautions, NPO status , Patient's Chart, lab work & pertinent test results  History of Anesthesia Complications Negative for: history of anesthetic complications  Airway Mallampati: I  TM Distance: >3 FB Neck ROM: Full    Dental no notable dental hx. (+) Teeth Intact   Pulmonary neg pulmonary ROS, neg sleep apnea, neg COPD, Patient abstained from smoking.Not current smoker,    Pulmonary exam normal breath sounds clear to auscultation       Cardiovascular Exercise Tolerance: Good METS(-) hypertension(-) CAD and (-) Past MI negative cardio ROS  (-) dysrhythmias  Rhythm:Regular Rate:Normal - Systolic murmurs    Neuro/Psych negative neurological ROS  negative psych ROS   GI/Hepatic neg GERD  ,(+)     (-) substance abuse  ,   Endo/Other  neg diabetes  Renal/GU negative Renal ROS     Musculoskeletal   Abdominal   Peds  Hematology   Anesthesia Other Findings Past Medical History: No date: Heart murmur     Comment:  Childhood. Resolved. No date: Wears contact lenses  Reproductive/Obstetrics                             Anesthesia Physical Anesthesia Plan  ASA: I  Anesthesia Plan: General   Post-op Pain Management:    Induction: Intravenous  PONV Risk Score and Plan: 2 and Ondansetron, Propofol infusion and TIVA  Airway Management Planned: Nasal Cannula  Additional Equipment: None  Intra-op Plan:   Post-operative Plan:   Informed Consent: I have reviewed the patients History and Physical, chart, labs and discussed the procedure including the risks, benefits and alternatives for the proposed anesthesia with the patient or authorized representative who has indicated his/her understanding and acceptance.     Dental advisory given  Plan Discussed with: CRNA and  Surgeon  Anesthesia Plan Comments: (Discussed risks of anesthesia with patient, including possibility of difficulty with spontaneous ventilation under anesthesia necessitating airway intervention, PONV, and rare risks such as cardiac or respiratory or neurological events. Patient understands.)        Anesthesia Quick Evaluation

## 2020-11-04 ENCOUNTER — Telehealth: Payer: Self-pay | Admitting: Gastroenterology

## 2020-11-04 NOTE — Telephone Encounter (Signed)
Messaged patient via MyChart.

## 2020-11-04 NOTE — Telephone Encounter (Signed)
Patient states she used the mineral oil hour before procedure. Patient states the leakage is pretty much clear. He states he thinks it is the mineral oil. Patient is having some rectal irritation. Patient states he is not having pain. Patient states he wants to know if there is anything he can do to get ride of the mineral oil leaking. He states it is bad enough that if he goes somewhere he will need to wear a adult pad.

## 2020-11-04 NOTE — Telephone Encounter (Signed)
Having problem post sigmoid problems w/ bowel leakage from prep. Patient wants to know how long will last and/or how to clear up.

## 2020-11-27 ENCOUNTER — Other Ambulatory Visit: Payer: Self-pay

## 2020-11-27 ENCOUNTER — Ambulatory Visit: Payer: BC Managed Care – PPO | Admitting: Nurse Practitioner

## 2020-11-27 ENCOUNTER — Encounter: Payer: Self-pay | Admitting: Nurse Practitioner

## 2020-11-27 VITALS — BP 114/74 | HR 73 | Temp 97.4°F | Resp 16 | Ht 68.0 in | Wt 150.0 lb

## 2020-11-27 DIAGNOSIS — R110 Nausea: Secondary | ICD-10-CM

## 2020-11-27 MED ORDER — ONDANSETRON 4 MG PO TBDP: 4.0000 mg | ORAL_TABLET | Freq: Three times a day (TID) | ORAL | 0 refills | Status: DC | PRN

## 2020-11-27 NOTE — Progress Notes (Signed)
Subjective:    Patient ID: Randall Evans, male    DOB: [birth year], 45 y.o.   MRN: [id number]  HPI  45 year old male presenting to clinic with complaints of 2-3 weeks of intermittent nausea.   Patient states he had a mild head cold prior to traveling to the beach 3 weeks ago. Prior to travel he took a COVID test which was negative. Felt fine during beach stay and on his return trip started to experience some nausea.   Since that time he has had intermittent bouts of nausea and some fatigue associated with episodes and dizziness at times. Denies fever or other systemic symptoms.   He does not wake up in the am with nausea Nausea seems to onset after some meals but not all  Yesterday had oatmeal for breakfast and did not feel well most of the day  This am had beans/eggs and has felt OK  Historically denies indigestion or heartburn. He keeps a very healthy diet full of lean meats, vegetables and low on starches. Exercises regularly and drinks alcohol rarely one glass of wine or a beer on weekends. Has one cup of coffee a day.   He does see a GI specialist and has suffered from internal hemorrhoids for years, has undergone banding. Had a sigmoidoscopy this year, and a colonoscopy in 2020 that were significant only for internal hemorrhoids.   Denies a history of food sensitivities, denies bloating or reflux.   Denies constipation, feels rectal bleeding is triggered by straining has not noticed a specific food trigger.   Today's Vitals   11/27/20 1028  BP: 114/74  Pulse: 73  Resp: 16  Temp: (!) 97.4 F (36.3 C)  TempSrc: Tympanic  SpO2: 99%  Weight: 150 lb (68 kg)  Height: 5\' 8"  (1.727 m)   Body mass index is 22.81 kg/m.  Review of Systems  Constitutional: Negative.   Gastrointestinal: Positive for nausea. Negative for abdominal distention, abdominal pain, diarrhea and vomiting.  Genitourinary: Negative.   Musculoskeletal: Negative.   Skin: Negative.   Neurological:  Positive for dizziness.   Past Medical History:  Diagnosis Date  . Heart murmur    Childhood. Resolved.  . Wears contact lenses       Objective:   Physical Exam Constitutional:      Appearance: Normal appearance.  HENT:     Head: Normocephalic.     Right Ear: Tympanic membrane, ear canal and external ear normal.     Left Ear: Tympanic membrane, ear canal and external ear normal.     Nose: Nose normal.     Mouth/Throat:     Mouth: Mucous membranes are moist.     Pharynx: Oropharynx is clear.  Cardiovascular:     Rate and Rhythm: Normal rate and regular rhythm.     Heart sounds: Normal heart sounds.  Pulmonary:     Effort: Pulmonary effort is normal.     Breath sounds: Normal breath sounds.  Abdominal:     General: Abdomen is flat. Bowel sounds are decreased.     Palpations: There is no mass.     Tenderness: There is no abdominal tenderness.  Musculoskeletal:     Cervical back: Normal range of motion.  Skin:    General: Skin is warm.  Neurological:     General: No focal deficit present.     Mental Status: He is alert.  Psychiatric:        Mood and Affect: Mood normal.  Assessment & Plan:  Recommended returning to GI to discuss upper GI workup. Recent symptoms may be related to food sensitivity/ consider gluten sensitivity.  Advised keeping a food journal to identify any trends in nausea onset.    Will Rx Zofran for episodic control, may consider PPI if symptoms persist and GI appointment is delayed.   Ddx: H. Pylori, ulcer, post-viral syndrome, celiac, IBS  Meds ordered this encounter  Medications  . ondansetron (ZOFRAN ODT) 4 MG disintegrating tablet    Sig: Take 1 tablet (4 mg total) by mouth every 8 (eight) hours as needed for nausea. Dissolve on or under tongue    Dispense:  20 tablet    Refill:  0

## 2020-12-08 ENCOUNTER — Encounter: Payer: Self-pay | Admitting: Gastroenterology

## 2020-12-08 NOTE — Telephone Encounter (Signed)
Made appointment for 12/11/2020 at 1:45

## 2020-12-11 ENCOUNTER — Encounter: Payer: Self-pay | Admitting: Gastroenterology

## 2020-12-11 ENCOUNTER — Ambulatory Visit: Payer: BC Managed Care – PPO | Admitting: Gastroenterology

## 2020-12-11 ENCOUNTER — Other Ambulatory Visit: Payer: Self-pay

## 2020-12-11 VITALS — BP 138/79 | HR 61 | Temp 97.4°F | Ht 68.0 in | Wt 148.5 lb

## 2020-12-11 DIAGNOSIS — K625 Hemorrhage of anus and rectum: Secondary | ICD-10-CM | POA: Diagnosis not present

## 2020-12-11 DIAGNOSIS — K640 First degree hemorrhoids: Secondary | ICD-10-CM

## 2020-12-11 NOTE — Progress Notes (Signed)
Randall Darby, MD [address]  [place]  [place], [place] [address]  Main: [phone]  Fax: [phone]    Gastroenterology Consultation  Referring Provider:     Marcy Salvo* Primary Care Physician:  Talmage Nap, PA-C Primary Gastroenterologist:  Dr. Cephas Evans Reason for Consultation:     Painless rectal bleeding        HPI:   Randall Evans is a 45 y.o. male referred by Dr. Talmage Nap, PA-C  for consultation & management of painless rectal bleeding.  Patient reports that approximately 2 years history of bright red blood per rectum, dripping in the toilet bowl as well as on wiping.  He underwent colonoscopy in 2019 for this complaint, found to have internal hemorrhoids only.  He was recently evaluated by Dr. Hampton Abbot for hemorrhoidectomy.  He was advised to try hemorrhoid ligation before undergoing surgery and therefore referred to me.  Patient denies any constipation.  Ever since he saw Dr. Hampton Abbot about 2 months ago, he said he has tried to relax more during difficult to reprocess which has helped to minimize the bleeding.  He does see blood on wiping.  He feels like hemorrhoid is protruding within the anal canal but not completely outside.  He is still interested to undergo hemorrhoid ligation  Follow-up visit 09/04/2020 Patient underwent hemorrhoid ligation of all 3 hemorrhoids for painless rectal bleeding. He reports he was doing well until earlier this week when he underwent cataract surgery on Monday. He was on clear liquid diet and he noticed painless rectal bleeding, bright red blood on wiping and in the toilet bowel on the day of surgery. Post surgery, he had hard bowel movement and followed by that, about 45 minutes later he noticed leakage and found to have stain his underwear and bedsheet with bright red blood. He is here to discuss if he needs fourth ligation. He did not have any further episodes of rectal bleeding since then.  His bowel movements are back to normal  Follow-up visit 12/11/2020 Patient has intermittent painless rectal bleeding, underwent flexible sigmoidoscopy which revealed internal hemorrhoids only.  He had another episode of rectal bleeding about a week ago, bright red blood per rectum dripping into the toilet.  He call my office yesterday, made a follow-up visit.  He reports scant blood on wiping today.  He denies any straining, having formed bowel movements.  Exercises regularly.  Denies prolonged sitting, follows good toilet hygiene  He does not smoke or drink alcohol He denies family history of GI malignancy  NSAIDs: None  Antiplts/Anticoagulants/Anti thrombotics: None  GI Procedures:  Colonoscopy 07/03/2018 - Non-bleeding internal hemorrhoids. - No specimens collected.  Flexible sigmoidoscopy 11/03/2020 - Non-bleeding external internal hemorrhoids. - The examination was otherwise normal. - No specimens collected.  Past Medical History:  Diagnosis Date  . Heart murmur    Childhood. Resolved.  . Wears contact lenses     Past Surgical History:  Procedure Laterality Date  . COLONOSCOPY WITH PROPOFOL N/A 07/03/2018   Procedure: COLONOSCOPY WITH PROPOFOL;  Surgeon: [name], MD;  Location: [place];  Service: Endoscopy;  Laterality: N/A;  . FLEXIBLE SIGMOIDOSCOPY N/A 11/03/2020   Procedure: FLEXIBLE SIGMOIDOSCOPY;  Surgeon: Lin Landsman, MD;  Location: [facility] ENDOSCOPY;  Service: Gastroenterology;  Laterality: N/A;  . MANDIBLE SURGERY    . MELANOMA EXCISION  2011   chest    Current Outpatient Medications:  .  ondansetron (ZOFRAN ODT) 4 MG disintegrating tablet,  Take 1 tablet (4 mg total) by mouth every 8 (eight) hours as needed for nausea. Dissolve on or under tongue (Patient not taking: Reported on 12/11/2020), Disp: 20 tablet, Rfl: 0   History reviewed. No pertinent family history.   Social History   Tobacco Use  . Smoking status: Never Smoker  .  Smokeless tobacco: Never Used  Vaping Use  . Vaping Use: Never used  Substance Use Topics  . Alcohol use: Yes    Alcohol/week: 3.0 standard drinks    Types: 3 Cans of beer per week  . Drug use: No    Allergies as of 12/11/2020  . (No Known Allergies)    Review of Systems:    All systems reviewed and negative except where noted in HPI.   Physical Exam:  BP 138/79 (BP Location: Left Arm, Patient Position: Sitting, Cuff Size: Normal)   Pulse 61   Temp (!) 97.4 F (36.3 C) (Oral)   Ht 5\' 8"  (1.727 m)   Wt 148 lb 8 oz (67.4 kg)   BMI 22.58 kg/m  No LMP for male patient.  General:   Alert,  Well-developed, well-nourished, pleasant and cooperative in NAD Head:  Normocephalic and atraumatic. Eyes:  Sclera clear, no icterus.   Conjunctiva pink. Ears:  Normal auditory acuity. Nose:  No deformity, discharge, or lesions. Mouth:  No deformity or lesions,oropharynx pink & moist. Neck:  Supple; no masses or thyromegaly. Lungs:  Respirations even and unlabored.  Clear throughout to auscultation.   No wheezes, crackles, or rhonchi. No acute distress. Heart:  Regular rate and rhythm; no murmurs, clicks, rubs, or gallops. Abdomen:  Normal bowel sounds. Soft, non-tender and non-distended without masses, hepatosplenomegaly or hernias noted.  No guarding or rebound tenderness.   Rectal: Anoscopy revealed right posterior hemorrhoid with stigmata of recent bleeding, nontender digital rectal exam Msk:  Symmetrical without gross deformities. Good, equal movement & strength bilaterally. Pulses:  Normal pulses noted. Extremities:  No clubbing or edema.  No cyanosis. Neurologic:  Alert and oriented x3;  grossly normal neurologically. Skin:  Intact without significant lesions or rashes. No jaundice. Psych:  Alert and cooperative. Normal mood and affect.  Imaging Studies: None  Assessment and Plan:   [name] is a 45 y.o. male with no significant past medical history is seen in for  follow-up of recurrence of  painless rectal bleeding s/p rubber band ligation of the RA, RP, LL  internal hemorrhoids  Recurrence of painless rectal bleeding Perform hemorrhoid ligation of RP today Recommend ligation of LL in 2 to 3 weeks  Follow up in 2 to 3 weeks   Randall Darby, MD

## 2020-12-11 NOTE — Progress Notes (Signed)
PROCEDURE NOTE: The patient presents with symptomatic grade 1 hemorrhoids, unresponsive to maximal medical therapy, requesting rubber band ligation of his/her hemorrhoidal disease.  All risks, benefits and alternative forms of therapy were described and informed consent was obtained.  The decision was made to band the RP internal hemorrhoid, and the [place] was used to perform band ligation without complication.  Digital anorectal examination was then performed to assure proper positioning of the band, and to adjust the banded tissue as required.  The patient was discharged home without pain or other issues.  Dietary and behavioral recommendations were given and (if necessary - prescriptions were given), along with follow-up instructions.  The patient will return in 2  weeks for follow-up and possible additional banding as required.  No complications were encountered and the patient tolerated the procedure well.

## 2020-12-24 ENCOUNTER — Ambulatory Visit: Payer: BC Managed Care – PPO | Admitting: Medical

## 2020-12-24 ENCOUNTER — Ambulatory Visit: Payer: BC Managed Care – PPO

## 2020-12-24 ENCOUNTER — Other Ambulatory Visit: Payer: Self-pay

## 2020-12-24 DIAGNOSIS — Z20822 Contact with and (suspected) exposure to covid-19: Secondary | ICD-10-CM

## 2020-12-24 DIAGNOSIS — J0100 Acute maxillary sinusitis, unspecified: Secondary | ICD-10-CM

## 2020-12-24 DIAGNOSIS — J301 Allergic rhinitis due to pollen: Secondary | ICD-10-CM

## 2020-12-24 LAB — POC COVID19 BINAXNOW: SARS Coronavirus 2 Ag: NEGATIVE

## 2020-12-24 MED ORDER — AMOXICILLIN-POT CLAVULANATE 875-125 MG PO TABS: 1.0000 | ORAL_TABLET | Freq: Two times a day (BID) | ORAL | 0 refills | Status: DC

## 2020-12-24 NOTE — Progress Notes (Signed)
   Subjective:    Patient ID: Randall Evans, male    DOB: 1975/10/19, 45 y.o.   MRN: [id number]  HPI 45 yo male in non acute distress consents to telemedicine appointment.  , Presents today with " allergy symptoms". Which started 2 weeks ago And consist   runnny nose that was clear to begin with but then changed to bright yellow 3-4 days ago. Wakes up in the morning with bad sinus headache, and having tenderness on left sided sinuses. He denies fever,  Sore throat chest pain or shortness of breath.   98% RA O2sat  Moderna vaccinated and boosted.  No Known Allergies  Review of Systems  Constitutional: Negative for chills and fever.  HENT: Positive for congestion, rhinorrhea, sinus pressure and sinus pain. Negative for ear pain and sore throat.   Respiratory: Negative for cough and shortness of breath.   Cardiovascular: Negative for chest pain.  Gastrointestinal: Negative for abdominal pain and diarrhea.  Allergic/Immunologic: Positive for environmental allergies.  Neurological: Positive for headaches (only in the morning).       Objective:   Physical Exam AXOX3 No distress noted on phone call. No physical exam performed due to telemedicine appointment.   Results for orders placed or performed in visit on 12/24/20 (from the past 24 hour(s))  POC COVID-19     Status: Normal   Collection Time: 12/24/20  8:38 AM  Result Value Ref Range   SARS Coronavirus 2 Ag Negative Negative      Assessment & Plan:  Sinusitis Allergic Rhinitis Seasonal allergies Meds ordered this encounter  Medications  . amoxicillin-clavulanate (AUGMENTIN) 875-125 MG tablet    Sig: Take 1 tablet by mouth 2 (two) times daily. Take with food    Dispense:  20 tablet    Refill:  0  If not improving in 3-5 days on antibiotics to contact office. Patient verbalizes understanding and has no questions at discharge. He is grateful we were able to get him  this morning.

## 2020-12-24 NOTE — Progress Notes (Signed)
   Subjective:    Patient ID: Randall Evans, male    DOB: [birth year], 45 y.o.   MRN: [id number]  HPI    Review of Systems     Objective:   Physical Exam        Assessment & Plan:

## 2020-12-24 NOTE — Patient Instructions (Signed)
Sinusitis, Adult Sinusitis is soreness and swelling (inflammation) of your sinuses. Sinuses are hollow spaces in the bones around your face. They are located:  Around your eyes.  In the middle of your forehead.  Behind your nose.  In your cheekbones. Your sinuses and nasal passages are lined with a fluid called mucus. Mucus drains out of your sinuses. Swelling can trap mucus in your sinuses. This lets germs (bacteria, virus, or fungus) grow, which leads to infection. Most of the time, this condition is caused by a virus. What are the causes? This condition is caused by:  Allergies.  Asthma.  Germs.  Things that block your nose or sinuses.  Growths in the nose (nasal polyps).  Chemicals or irritants in the air.  Fungus (rare). What increases the risk? You are more likely to develop this condition if:  You have a weak body defense system (immune system).  You do a lot of swimming or diving.  You use nasal sprays too much.  You smoke. What are the signs or symptoms? The main symptoms of this condition are pain and a feeling of pressure around the sinuses. Other symptoms include:  Stuffy nose (congestion).  Runny nose (drainage).  Swelling and warmth in the sinuses.  Headache.  Toothache.  A cough that may get worse at night.  Mucus that collects in the throat or the back of the nose (postnasal drip).  Being unable to smell and taste.  Being very tired (fatigue).  A fever.  Sore throat.  Bad breath. How is this diagnosed? This condition is diagnosed based on:  Your symptoms.  Your medical history.  A physical exam.  Tests to find out if your condition is short-term (acute) or long-term (chronic). Your doctor may: ? Check your nose for growths (polyps). ? Check your sinuses using a tool that has a light (endoscope). ? Check for allergies or germs. ? Do imaging tests, such as an MRI or CT scan. How is this treated? Treatment for this condition  depends on the cause and whether it is short-term or long-term.  If caused by a virus, your symptoms should go away on their own within 10 days. You may be given medicines to relieve symptoms. They include: ? Medicines that shrink swollen tissue in the nose. ? Medicines that treat allergies (antihistamines). ? A spray that treats swelling of the nostrils. ? Rinses that help get rid of thick mucus in your nose (nasal saline washes).  If caused by bacteria, your doctor may wait to see if you will get better without treatment. You may be given antibiotic medicine if you have: ? A very bad infection. ? A weak body defense system.  If caused by growths in the nose, you may need to have surgery. Follow these instructions at home: Medicines  Take, use, or apply over-the-counter and prescription medicines only as told by your doctor. These may include nasal sprays.  If you were prescribed an antibiotic medicine, take it as told by your doctor. Do not stop taking the antibiotic even if you start to feel better. Hydrate and humidify  Drink enough water to keep your pee (urine) pale yellow.  Use a cool mist humidifier to keep the humidity level in your home above 50%.  Breathe in steam for 10-15 minutes, 3-4 times a day, or as told by your doctor. You can do this in the bathroom while a hot shower is running.  Try not to spend time in cool or dry air.     Rest  Rest as much as you can.  Sleep with your head raised (elevated).  Make sure you get enough sleep each night. General instructions  Put a warm, moist washcloth on your face 3-4 times a day, or as often as told by your doctor. This will help with discomfort.  Wash your hands often with soap and water. If there is no soap and water, use hand sanitizer.  Do not smoke. Avoid being around people who are smoking (secondhand smoke).  Keep all follow-up visits as told by your doctor. This is important.   Contact a doctor if:  You  have a fever.  Your symptoms get worse.  Your symptoms do not get better within 10 days. Get help right away if:  You have a very bad headache.  You cannot stop throwing up (vomiting).  You have very bad pain or swelling around your face or eyes.  You have trouble seeing.  You feel confused.  Your neck is stiff.  You have trouble breathing. Summary  Sinusitis is swelling of your sinuses. Sinuses are hollow spaces in the bones around your face.  This condition is caused by tissues in your nose that become inflamed or swollen. This traps germs. These can lead to infection.  If you were prescribed an antibiotic medicine, take it as told by your doctor. Do not stop taking it even if you start to feel better.  Keep all follow-up visits as told by your doctor. This is important. This information is not intended to replace advice given to you by your health care provider. Make sure you discuss any questions you have with your health care provider. Document Revised: 02/06/2018 Document Reviewed: 02/06/2018 Elsevier Patient Education  2021 [employer]. Allergic Rhinitis, Adult Allergic rhinitis is a reaction to allergens. Allergens are things that can cause an allergic reaction. This condition affects the lining inside the nose (mucous membrane). There are two types of allergic rhinitis:  Seasonal. This type is also called hay fever. It happens only during some times of the year.  Perennial. This type can happen at any time of the year. This condition cannot be spread from person to person (is not contagious). It can be mild, worse, or very bad. It can develop at any age and may be outgrown. What are the causes? This condition may be caused by:  Pollen from grasses, trees, and weeds.  Dust mites.  Smoke.  Mold.  Car fumes.  The pee (urine), spit, or dander of pets. Dander is dead skin cells from a pet.   What increases the risk? You are more likely to develop this  condition if:  You have allergies in your family.  You have problems like allergies in your family. You may have: ? Swelling of parts of your eyes and eyelids. ? Asthma. This affects how you breathe. ? Long-term redness and swelling on your skin. ? Food allergies. What are the signs or symptoms? The main symptom of this condition is a runny or stuffy nose (nasal congestion). Other symptoms may include:  Sneezing or coughing.  Itching and tearing of your eyes.  Mucus that drips down the back of your throat (postnasal drip).  Trouble sleeping.  Feeling tired.  Headache.  Sore throat. How is this treated? There is no cure for this condition. You should avoid things that you are allergic to. Treatment can help to relieve symptoms. This may include:  Medicines that block allergy symptoms, such as corticosteroids or antihistamines. These may  be given as a shot, nasal spray, or pill.  Avoiding things you are allergic to.  Medicines that give you bits of what you are allergic to over time. This is called immunotherapy. It is done if other treatments do not help. You may get: ? Shots. ? Medicine under your tongue.  Stronger medicines, if other treatments do not help. Follow these instructions at home: Avoiding allergens Find out what things you are allergic to and avoid them. To do this, try these things:  If you get allergies any time of year: ? Replace carpet with wood, tile, or vinyl flooring. Carpet can trap pet dander and dust. ? Do not smoke. Do not allow smoking in your home. ? Change your heating and air conditioning filters at least once a month.  If you get allergies only some times of the year: ? Keep windows closed when you can. ? Plan things to do outside when pollen counts are lowest. Check pollen counts before you plan things to do outside. ? When you come indoors, change your clothes and shower before you sit on furniture or bedding.   If you are allergic to  a pet: ? Keep the pet out of your bedroom. ? Vacuum, sweep, and dust often.   General instructions  Take over-the-counter and prescription medicines only as told by your doctor.  Drink enough fluid to keep your pee (urine) pale yellow.  Keep all follow-up visits as told by your doctor. This is important. Where to find more information  American Academy of Allergy, Asthma & Immunology: www.aaaai.org Contact a doctor if:  You have a fever.  You get a cough that does not go away.  You make whistling sounds when you breathe (wheeze).  Your symptoms slow you down.  Your symptoms stop you from doing your normal things each day. Get help right away if:  You are short of breath. This symptom may be an emergency. Do not wait to see if the symptom will go away. Get medical help right away. Call your local emergency services (911 in the [place] Do not drive yourself to the hospital. Summary  Allergic rhinitis may be treated by taking medicines and avoiding things you are allergic to.  If you have allergies only some of the year, keep windows closed when you can at those times.  Contact your doctor if you get a fever or a cough that does not go away. This information is not intended to replace advice given to you by your health care provider. Make sure you discuss any questions you have with your health care provider. Document Revised: 10/29/2019 Document Reviewed: 09/04/2019 Elsevier Patient Education  2021 [employer].

## 2021-01-06 ENCOUNTER — Encounter: Payer: Self-pay | Admitting: Gastroenterology

## 2021-01-06 ENCOUNTER — Other Ambulatory Visit: Payer: Self-pay

## 2021-01-06 ENCOUNTER — Ambulatory Visit: Payer: BC Managed Care – PPO | Admitting: Gastroenterology

## 2021-01-06 VITALS — BP 129/72 | HR 70 | Temp 97.7°F | Ht 68.0 in | Wt 156.1 lb

## 2021-01-06 DIAGNOSIS — K625 Hemorrhage of anus and rectum: Secondary | ICD-10-CM | POA: Diagnosis not present

## 2021-01-06 DIAGNOSIS — K640 First degree hemorrhoids: Secondary | ICD-10-CM

## 2021-01-06 NOTE — Progress Notes (Signed)
PROCEDURE NOTE: The patient presents with symptomatic grade 1 hemorrhoids, unresponsive to maximal medical therapy, requesting rubber band ligation of his/her hemorrhoidal disease.  All risks, benefits and alternative forms of therapy were described and informed consent was obtained.  The decision was made to band the RA internal hemorrhoid, and the [place] was used to perform band ligation without complication.  Digital anorectal examination was then performed to assure proper positioning of the band, and to adjust the banded tissue as required.  The patient was discharged home without pain or other issues.  Dietary and behavioral recommendations were given and (if necessary - prescriptions were given), along with follow-up instructions.  The patient will return in 2  weeks for follow-up and possible additional banding as required.  No complications were encountered and the patient tolerated the procedure well.

## 2021-03-31 ENCOUNTER — Other Ambulatory Visit: Payer: Self-pay

## 2021-03-31 ENCOUNTER — Telehealth: Payer: BC Managed Care – PPO | Admitting: Medical

## 2021-03-31 MED ORDER — AMOXICILLIN-POT CLAVULANATE 875-125 MG PO TABS: 1.0000 | ORAL_TABLET | Freq: Two times a day (BID) | ORAL | 0 refills | Status: DC

## 2021-03-31 NOTE — Patient Instructions (Signed)
COVID-19: What to Do if You Are Sick CDC has updated isolation and quarantine recommendations for the public, and is revising the CDC website to reflect these changes. These recommendations do not apply to healthcare personnel and do not supersede state, local, tribal, or territorial laws, rules, andregulations. If you have a fever, cough or other symptoms, you might have COVID-19. Most people have mild illness and are able to recover at home. If you are sick: Keep track of your symptoms. If you have an emergency warning sign (including trouble breathing), call 911. Steps to help prevent the spread of COVID-19 if you are sick If you are sick with COVID-19 or think you might have COVID-19, follow the steps below to care for yourself and to help protect other peoplein your home and community. Stay home except to get medical care Stay home. Most people with COVID-19 have mild illness and can recover at home without medical care. Do not leave your home, except to get medical care. Do not visit public areas. Take care of yourself. Get rest and stay hydrated. Take over-the-counter medicines, such as acetaminophen, to help you feel better. Stay in touch with your doctor. Call before you get medical care. Be sure to get care if you have trouble breathing, or have any other emergency warning signs, or if you think it is an emergency. Avoid public transportation, ride-sharing, or taxis. Separate yourself from other people As much as possible, stay in a specific room and away from other people and pets in your home. If possible, you should use a separate bathroom. If you need to be around other people or animals in oroutside of the home, wear a mask. Tell your close contactsthat they may have been exposed to COVID-19. An infected person can spread COVID-19 starting 48 hours (or 2 days) before the person has any symptoms or tests positive. By letting your close contacts know they may have been exposed to COVID-19,  you are helping to protect everyone. Additional guidance is available for those living in close quarters and shared housing. See COVID-19 and Animals if you have questions about pets. If you are diagnosed with COVID-19, someone from the health department may call you. Answer the call to slow the spread. Monitor your symptoms Symptoms of COVID-19 include fever, cough, or other symptoms. Follow care instructions from your healthcare provider and local health department. Your local health authorities may give instructions on checking your symptoms and reporting information. When to seek emergency medical attention Look for emergency warning signs* for COVID-19. If someone is showing any of these signs, seek emergency medical care immediately: Trouble breathing Persistent pain or pressure in the chest New confusion Inability to wake or stay awake Pale, gray, or blue-colored skin, lips, or nail beds, depending on skin tone *This list is not all possible symptoms. Please call your medical provider forany other symptoms that are severe or concerning to you. Call 911 or call ahead to your local emergency facility: Notify the operator that you are seeking care for someone who has or may haveCOVID-19. Call ahead before visiting your doctor Call ahead. Many medical visits for routine care are being postponed or done by phone or telemedicine. If you have a medical appointment that cannot be postponed, call your doctor's office, and tell them you have or may have COVID-19. This will help the office protect themselves and other patients. Get tested If you have symptoms of COVID-19, get tested. While waiting for test results, you stay away from others,  including staying apart from those living in your household. Self-tests are one of several options for testing for the virus that causes COVID-19 and may be more convenient than laboratory-based tests and point-of-care tests. Ask your healthcare provider or  your local health department if you need help interpreting your test results. You can visit your state, tribal, local, and territorial health department's website to look for the latest local information on testing sites. If you are sick, wear a mask over your nose and mouth You should wear a mask over your nose and mouth if you must be around other people or animals, including pets (even at home). You don't need to wear the mask if you are alone. If you can't put on a mask (because of trouble breathing, for example), cover your coughs and sneezes in some other way. Try to stay at least 6 feet away from other people. This will help protect the people around you. Masks should not be placed on young children under age 1 years, anyone who has trouble breathing, or anyone who is not able to remove the mask without help. Note: During the COVID-19 pandemic, medical grade facemasks are reserved forhealthcare workers and some first responders. Cover your coughs and sneezes Cover your mouth and nose with a tissue when you cough or sneeze. Throw away used tissues in a lined trash can. Immediately wash your hands with soap and water for at least 20 seconds. If soap and water are not available, clean your hands with an alcohol-based hand sanitizer that contains at least 60% alcohol. Clean your hands often Wash your hands often with soap and water for at least 20 seconds. This is especially important after blowing your nose, coughing, or sneezing; going to the bathroom; and before eating or preparing food. Use hand sanitizer if soap and water are not available. Use an alcohol-based hand sanitizer with at least 60% alcohol, covering all surfaces of your hands and rubbing them together until they feel dry. Soap and water are the best option, especially if hands are visibly dirty. Avoid touching your eyes, nose, and mouth with unwashed hands. Handwashing Tips Avoid sharing personal household items Do not share  dishes, drinking glasses, cups, eating utensils, towels, or bedding with other people in your home. Wash these items thoroughly after using them with soap and water or put in the dishwasher. Clean all "high-touch" surfaces every day Clean and disinfect high-touch surfaces in your "sick room" and bathroom; wear disposable gloves. Let someone else clean and disinfect surfaces in common areas, but you should clean your bedroom and bathroom, if possible. If a caregiver or other person needs to clean and disinfect a sick person's bedroom or bathroom, they should do so on an as-needed basis. The caregiver/other person should wear a mask and disposable gloves prior to cleaning. They should wait as long as possible after the person who is sick has used the bathroom before coming in to clean and use the bathroom. High-touch surfaces include phones, remote controls, counters, tabletops, doorknobs, bathroom fixtures, toilets, keyboards, tablets, and bedside tables. Clean and disinfect areas that may have blood, stool, or body fluids on them. Use household cleaners and disinfectants. Clean the area or item with soap and water or another detergent if it is dirty. Then, use a household disinfectant. Be sure to follow the instructions on the label to ensure safe and effective use of the product. Many products recommend keeping the surface wet for several minutes to ensure germs are killed. Many  also recommend precautions such as wearing gloves and making sure you have good ventilation during use of the product. Use a product from [employer] List N: Disinfectants for Coronavirus (EUMPN-36). Complete Disinfection Guidance When you can be around others after being sick with COVID-19 Deciding when you can be around others is different for different situations. Find out when you can safely end home isolation. For any additional questions about your care,contact your healthcare provider or state or local health  department. 08/27/2020 Content source: [facility] ([facility]), Division of Viral Diseases This information is not intended to replace advice given to you by your health care provider. Make sure you discuss any questions you have with your healthcare provider. Document Revised: 10/24/2020 Document Reviewed: 10/24/2020 Elsevier Patient Education  [place].

## 2021-03-31 NOTE — Progress Notes (Signed)
   Subjective:    Patient ID: Randall Evans, male    DOB: 1976-04-10, 45 y.o.   MRN: [id number]  HPI 45 yo male in non acute distress consents to telemedicine appointment. First symptoms scratchy throat Family vacation last week and dance for his child, Last Monday daughter was positive then patient had symptoms on Thursday , tested  Last Friday  positive. Feels better now then the last  2 days  Has pressure in nose area and both ears, jpopping.Marland Kitchen    No Known Allergies  Review of Systems  Constitutional:  Positive for chills. Negative for fever.  HENT:  Positive for congestion, postnasal drip (mild) and sinus pressure (using afrin day 3). Negative for ear discharge, ear pain and sore throat.   Respiratory:  Positive for cough. Negative for chest tightness, shortness of breath and wheezing.   Cardiovascular:  Negative for chest pain.  Gastrointestinal:  Negative for abdominal pain and diarrhea.  Neurological:  Positive for headaches (in the beginning). Negative for dizziness (early on better.), syncope and light-headedness.     No color with nasal discharge Slight yellow with mucous from throat. Objective:   Physical Exam  AXOX3 No acute distress noted on phone call. NO physical performed due to telemedicine appointment.       Assessment & Plan:  Covid-19 infection  OTC Zyrtec, Flonase,  plain Mucinex take per package instructions. Augmentin  prescribed in case increase of yellow d/c from nose or mouth to more yellow/ green. Or fever of 101.5 or higher.  Patients family has all had Covid-19. Rest, increase fluids, Motrin or Tylenol for fever or if not feeling well. May return  on Wednesday of next week to work if all symptoms are improving and no fever x 24 hours with no OTC Motrin or Tylenol being taken. Patient verbalizes understanding and has no questions at discharge. Meds ordered this encounter  Medications   amoxicillin-clavulanate (AUGMENTIN) 875-125 MG tablet     Sig: Take 1 tablet by mouth 2 (two) times daily. Take with food    Dispense:  20 tablet    Refill:  0

## 2021-06-10 ENCOUNTER — Ambulatory Visit: Payer: BC Managed Care – PPO

## 2021-06-22 ENCOUNTER — Encounter: Payer: Self-pay | Admitting: Gastroenterology

## 2021-07-15 ENCOUNTER — Ambulatory Visit: Payer: BC Managed Care – PPO | Admitting: Gastroenterology

## 2021-08-05 ENCOUNTER — Encounter: Payer: Self-pay | Admitting: Gastroenterology

## 2021-08-05 ENCOUNTER — Ambulatory Visit (INDEPENDENT_AMBULATORY_CARE_PROVIDER_SITE_OTHER): Payer: BC Managed Care – PPO | Admitting: Gastroenterology

## 2021-08-05 VITALS — BP 128/76 | HR 64 | Temp 97.8°F | Ht 68.0 in | Wt 149.0 lb

## 2021-08-05 DIAGNOSIS — K640 First degree hemorrhoids: Secondary | ICD-10-CM | POA: Diagnosis not present

## 2021-08-05 DIAGNOSIS — K625 Hemorrhage of anus and rectum: Secondary | ICD-10-CM

## 2021-08-05 MED ORDER — PRAMOXINE HCL (PERIANAL) 1 % EX FOAM: Freq: Four times a day (QID) | CUTANEOUS | 1 refills | Status: DC | PRN

## 2021-08-05 NOTE — Progress Notes (Signed)
Randall Darby, MD [address]  [place]  [place], [place] [address]  Main: [phone]  Fax: [phone]    Gastroenterology Consultation  Referring Provider:     Marcy Salvo* Primary Care Physician:  Talmage Nap, PA-C Primary Gastroenterologist:  Dr. Cephas Evans Reason for Consultation:     Painless rectal bleeding        HPI:   FINNEUS Evans is a 45 y.o. male referred by Dr. Talmage Nap, PA-C  for consultation & management of painless rectal bleeding.  Patient reports that approximately 2 years history of bright red blood per rectum, dripping in the toilet bowl as well as on wiping.  He underwent colonoscopy in 2019 for this complaint, found to have internal hemorrhoids only.  He was recently evaluated by Dr. Hampton Abbot for hemorrhoidectomy.  He was advised to try hemorrhoid ligation before undergoing surgery and therefore referred to me.  Patient denies any constipation.  Ever since he saw Dr. Hampton Abbot about 2 months ago, he said he has tried to relax more during difficult to reprocess which has helped to minimize the bleeding.  He does see blood on wiping.  He feels like hemorrhoid is protruding within the anal canal but not completely outside.  He is still interested to undergo hemorrhoid ligation  Follow-up visit 09/04/2020 Patient underwent hemorrhoid ligation of all 3 hemorrhoids for painless rectal bleeding. He reports he was doing well until earlier this week when he underwent cataract surgery on Monday. He was on clear liquid diet and he noticed painless rectal bleeding, bright red blood on wiping and in the toilet bowel on the day of surgery. Post surgery, he had hard bowel movement and followed by that, about 45 minutes later he noticed leakage and found to have stain his underwear and bedsheet with bright red blood. He is here to discuss if he needs fourth ligation. He did not have any further episodes of rectal bleeding since then.  His bowel movements are back to normal  Follow-up visit 12/11/2020 Patient has intermittent painless rectal bleeding, underwent flexible sigmoidoscopy which revealed internal hemorrhoids only.  He had another episode of rectal bleeding about a week ago, bright red blood per rectum dripping into the toilet.  He call my office yesterday, made a follow-up visit.  He reports scant blood on wiping today.  He denies any straining, having formed bowel movements.  Exercises regularly.  Denies prolonged sitting, follows good toilet hygiene  Follow-up visit 08/05/2021 Patient is here to discuss about recurrence of painless rectal bleeding.  He messaged me via MyChart on 10/3 secondary to an episode of bright red blood per rectum on wiping.  This has subsided and he canceled the follow-up appointment.  2 weeks ago, he had another episode which was similar to the previous one.  Patient denies any dripping of blood into the toilet bowl.  He definitely feels that the banding has helped to reduce the amount of bleeding as well as the episodes have been less frequent.  He does report intermittent episodes of hard bowel movements associated with straining even though he does follow a high-fiber diet and adequate intake of water.  He has been taking Colace 200 mg daily which is helping.  He does not smoke or drink alcohol He denies family history of GI malignancy  NSAIDs: None  Antiplts/Anticoagulants/Anti thrombotics: None  GI Procedures:  Colonoscopy 07/03/2018 - Non-bleeding internal hemorrhoids. - No specimens collected.  Flexible sigmoidoscopy 11/03/2020 -  Non-bleeding external internal hemorrhoids. - The examination was otherwise normal. - No specimens collected.  Past Medical History:  Diagnosis Date   Heart murmur    Childhood. Resolved.   Wears contact lenses     Past Surgical History:  Procedure Laterality Date   COLONOSCOPY WITH PROPOFOL N/A 07/03/2018   Procedure: COLONOSCOPY WITH  PROPOFOL;  Surgeon: [name], MD;  Location: [place];  Service: Endoscopy;  Laterality: N/A;   FLEXIBLE SIGMOIDOSCOPY N/A 11/03/2020   Procedure: FLEXIBLE SIGMOIDOSCOPY;  Surgeon: Lin Landsman, MD;  Location: [facility] ENDOSCOPY;  Service: Gastroenterology;  Laterality: N/A;   MANDIBLE SURGERY     MELANOMA EXCISION  2011   chest    Current Outpatient Medications:    amoxicillin-clavulanate (AUGMENTIN) 875-125 MG tablet, Take 1 tablet by mouth 2 (two) times daily. Take with food (Patient not taking: Reported on 08/05/2021), Disp: 20 tablet, Rfl: 0   History reviewed. No pertinent family history.   Social History   Tobacco Use   Smoking status: Never   Smokeless tobacco: Never  Vaping Use   Vaping Use: Never used  Substance Use Topics   Alcohol use: Yes    Alcohol/week: 3.0 standard drinks    Types: 3 Cans of beer per week   Drug use: No    Allergies as of 08/05/2021   (No Known Allergies)    Review of Systems:    All systems reviewed and negative except where noted in HPI.   Physical Exam:  BP 128/76 (BP Location: Right Arm, Patient Position: Sitting, Cuff Size: Normal)   Pulse 64   Temp 97.8 F (36.6 C) (Oral)   Ht 5\' 8"  (1.727 m)   Wt 149 lb (67.6 kg)   BMI 22.66 kg/m  No LMP for male patient.  General:   Alert,  Well-developed, well-nourished, pleasant and cooperative in NAD Head:  Normocephalic and atraumatic. Eyes:  Sclera clear, no icterus.   Conjunctiva pink. Ears:  Normal auditory acuity. Nose:  No deformity, discharge, or lesions. Mouth:  No deformity or lesions,oropharynx pink & moist. Neck:  Supple; no masses or thyromegaly. Lungs:  Respirations even and unlabored.  Clear throughout to auscultation.   No wheezes, crackles, or rhonchi. No acute distress. Heart:  Regular rate and rhythm; no murmurs, clicks, rubs, or gallops. Abdomen:  Normal bowel sounds. Soft, non-tender and non-distended without masses, hepatosplenomegaly or hernias  noted.  No guarding or rebound tenderness.   Rectal: Anoscopy revealed right posterior and right anterior hemorrhoids prolapsing during withdrawal, nontender digital rectal exam Msk:  Symmetrical without gross deformities. Good, equal movement & strength bilaterally. Pulses:  Normal pulses noted. Extremities:  No clubbing or edema.  No cyanosis. Neurologic:  Alert and oriented x3;  grossly normal neurologically. Skin:  Intact without significant lesions or rashes. No jaundice. Psych:  Alert and cooperative. Normal mood and affect.  Imaging Studies: None  Assessment and Plan:   BRADEN CIMO is a 44 y.o. male with no significant past medical history is seen in for follow-up of recurrence of  painless rectal bleeding s/p rubber band ligation x 5, helped with improved frequency and amount of bleeding.  Recurrence of painless rectal bleeding Perform hemorrhoid ligation of RP today  Follow up as needed   Randall Darby, MD

## 2021-08-05 NOTE — Progress Notes (Signed)
Was unable to catch patient to sign consent form for banding

## 2021-08-18 ENCOUNTER — Encounter: Payer: Self-pay | Admitting: Medical

## 2021-08-18 ENCOUNTER — Other Ambulatory Visit: Payer: Self-pay

## 2021-08-18 ENCOUNTER — Ambulatory Visit: Payer: BC Managed Care – PPO | Admitting: Medical

## 2021-08-18 VITALS — BP 110/74 | HR 62 | Temp 97.3°F | Resp 16

## 2021-08-18 DIAGNOSIS — J0100 Acute maxillary sinusitis, unspecified: Secondary | ICD-10-CM

## 2021-08-18 DIAGNOSIS — Z8709 Personal history of other diseases of the respiratory system: Secondary | ICD-10-CM

## 2021-08-18 MED ORDER — AMOXICILLIN-POT CLAVULANATE 875-125 MG PO TABS: 1.0000 | ORAL_TABLET | Freq: Two times a day (BID) | ORAL | 0 refills | Status: DC

## 2021-08-18 NOTE — Patient Instructions (Signed)

## 2021-08-18 NOTE — Progress Notes (Signed)
   Subjective:    Patient ID: Randall Evans, male    DOB: 1976-06-11, 45 y.o.   MRN: [id number]  HPI 45 yo male in non acute distress presents today with  sinus pressure , HA and cough ,since August 13, 2021, now blowing green discharge from nostrils. Mild cough but more like clearing his throat vs a true cough. Denies SOB, CP , fever or chills.. usisng Afrin and loratadine since Saaturday.  Tested last week for Covid-19 Negative., 08/13/2021. No known  Covid-19 exposure..  Blood pressure 110/74, pulse 62, temperature (!) 97.3 F (36.3 C), temperature source Tympanic, resp. rate 16, SpO2 99 %.  .No Known Allergies    Review of Systems  Constitutional:  Negative for chills, fatigue and fever.  HENT:  Positive for congestion, rhinorrhea, sinus pressure and sinus pain (blowing out green mucus). Negative for ear pain, postnasal drip and sore throat.   Respiratory:  Positive for cough (mild non productive mostly from clearing throat). Negative for shortness of breath.   Cardiovascular:  Negative for chest pain.  Gastrointestinal:  Negative for abdominal pain and diarrhea.  Genitourinary:  Negative for difficulty urinating.  Musculoskeletal:  Negative for myalgias.  Allergic/Immunologic: Positive for environmental allergies. Negative for food allergies.  Neurological:  Positive for headaches. Negative for dizziness, syncope and light-headedness (Using Afrin and Loratadine).      Objective:   Physical Exam Vitals and nursing note reviewed.  Constitutional:      Appearance: Normal appearance. He is normal weight.  HENT:     Head: Normocephalic and atraumatic.     Right Ear: Hearing, ear canal and external ear normal. A middle ear effusion is present.     Left Ear: Hearing, ear canal and external ear normal. A middle ear effusion is present.     Mouth/Throat:     Lips: Pink.     Mouth: Mucous membranes are moist.     Pharynx: Uvula midline. No pharyngeal swelling, oropharyngeal  exudate, posterior oropharyngeal erythema or uvula swelling.  Neurological:     Mental Status: He is alert.          Assessment & Plan:  Sinusitis maxillary Hx of sinusitis  Meds ordered this encounter  Medications   amoxicillin-clavulanate (AUGMENTIN) 875-125 MG tablet    Sig: Take 1 tablet by mouth 2 (two) times daily.    Dispense:  20 tablet    Refill:  0   Rest, increase fluids, OTC Motrin or Tylenol per package instructions. Return in 3-5 days if not improving. Patient verbalizes understanding and has no questions at discharge.

## 2021-08-25 ENCOUNTER — Ambulatory Visit: Payer: BC Managed Care – PPO | Admitting: Gastroenterology

## 2021-10-01 ENCOUNTER — Ambulatory Visit: Payer: Self-pay | Admitting: Urology

## 2021-10-07 ENCOUNTER — Other Ambulatory Visit: Payer: Self-pay

## 2021-10-07 ENCOUNTER — Ambulatory Visit: Payer: BC Managed Care – PPO | Admitting: Urology

## 2021-10-07 ENCOUNTER — Encounter: Payer: Self-pay | Admitting: Urology

## 2021-10-07 VITALS — BP 129/76 | HR 60 | Ht 68.0 in | Wt 150.0 lb

## 2021-10-07 DIAGNOSIS — Z3009 Encounter for other general counseling and advice on contraception: Secondary | ICD-10-CM | POA: Diagnosis not present

## 2021-10-07 MED ORDER — DIAZEPAM 5 MG PO TABS
5.0000 mg | ORAL_TABLET | Freq: Once | ORAL | 0 refills | Status: DC | PRN
Start: 2021-10-07 — End: 2021-11-10

## 2021-10-07 NOTE — Patient Instructions (Signed)

## 2021-10-07 NOTE — Progress Notes (Signed)
° °  10/07/21 2:10 PM   Randall Evans 10-18-1975 [id number]  CC: Discuss vasectomy  HPI: Healthy 46 year old male with 2 children who desires vasectomy.  He denies any urinary symptoms or family history of prostate cancer   PMH: Past Medical History:  Diagnosis Date   Heart murmur    Childhood. Resolved.   Wears contact lenses     Surgical History: Past Surgical History:  Procedure Laterality Date   COLONOSCOPY WITH PROPOFOL N/A 07/03/2018   Procedure: COLONOSCOPY WITH PROPOFOL;  Surgeon: [name], MD;  Location: [place];  Service: Endoscopy;  Laterality: N/A;   FLEXIBLE SIGMOIDOSCOPY N/A 11/03/2020   Procedure: FLEXIBLE SIGMOIDOSCOPY;  Surgeon: Lin Landsman, MD;  Location: [facility] ENDOSCOPY;  Service: Gastroenterology;  Laterality: N/A;   MANDIBLE SURGERY     MELANOMA EXCISION  2011   chest     Social History:  reports that he has never smoked. He has never used smokeless tobacco. He reports current alcohol use of about 3.0 standard drinks per week. He reports that he does not use drugs.  Physical Exam: BP 129/76    Pulse 60    Ht 5\' 8"  (1.727 m)    Wt 150 lb (68 kg)    BMI 22.81 kg/m    Constitutional:  Alert and oriented, No acute distress. Cardiovascular: No clubbing, cyanosis, or edema. Respiratory: Normal respiratory effort, no increased work of breathing. GI: Abdomen is soft, nontender, nondistended, no abdominal masses GU: Vas deferens easily palpable bilaterally, testicles 20 cc and descended bilaterally without masses  Assessment & Plan:   Healthy 46 year old male who desires vasectomy for permanent sterilization  We discussed the risks and benefits of vasectomy at length.  Vasectomy is intended to be a permanent form of contraception, and does not produce immediate sterility.  Following vasectomy another form of contraception is required until vas occlusion is confirmed by a post-vasectomy semen analysis obtained 2-3 months after the  procedure.  Even after vas occlusion is confirmed, vasectomy is not 100% reliable in preventing pregnancy, and the failure rate is approximately 09/1998.  Repeat vasectomy is required in less than 1% of patients.  He should refrain from ejaculation for 1 week after vasectomy.  Options for fertility after vasectomy include vasectomy reversal, and sperm retrieval with in vitro fertilization or ICSI.  These options are not always successful and may be expensive.  Finally, there are other permanent and non-permanent alternatives to vasectomy available. There is no risk of erectile dysfunction, and the volume of semen will be similar to prior, as the majority of the ejaculate is from the prostate and seminal vesicles.   The procedure takes ~20 minutes.  We recommend patients take 5-10 mg of Valium 30 minutes prior, and he will need a driver post-procedure.  Local anesthetic is injected into the scrotal skin and a small segment of the vas deferens is removed, and the ends occluded. The complication rate is approximately 1-2%, and includes bleeding, infection, and development of chronic scrotal pain.  PLAN: Schedule vasectomy Valium sent to pharmacy   Randall Madrid, MD 10/07/2021  [facility] [address], [place], [place] [address] [phone]

## 2021-10-20 DIAGNOSIS — Z85820 Personal history of malignant melanoma of skin: Secondary | ICD-10-CM | POA: Diagnosis not present

## 2021-10-20 DIAGNOSIS — Z08 Encounter for follow-up examination after completed treatment for malignant neoplasm: Secondary | ICD-10-CM | POA: Diagnosis not present

## 2021-10-20 DIAGNOSIS — L821 Other seborrheic keratosis: Secondary | ICD-10-CM | POA: Diagnosis not present

## 2021-10-20 DIAGNOSIS — D225 Melanocytic nevi of trunk: Secondary | ICD-10-CM | POA: Diagnosis not present

## 2021-11-02 ENCOUNTER — Other Ambulatory Visit: Payer: Self-pay

## 2021-11-02 ENCOUNTER — Ambulatory Visit: Payer: BC Managed Care – PPO | Admitting: Medical

## 2021-11-02 VITALS — BP 104/70 | HR 56 | Temp 97.9°F | Resp 16

## 2021-11-02 DIAGNOSIS — J029 Acute pharyngitis, unspecified: Secondary | ICD-10-CM

## 2021-11-02 LAB — POCT RAPID STREP A (OFFICE): Rapid Strep A Screen: NEGATIVE

## 2021-11-02 MED ORDER — AMOXICILLIN-POT CLAVULANATE 875-125 MG PO TABS: 1.0000 | ORAL_TABLET | Freq: Two times a day (BID) | ORAL | 0 refills | Status: DC

## 2021-11-02 NOTE — Progress Notes (Signed)
° °  Subjective:    Patient ID: Randall Evans, male    DOB: [birth year], 46 y.o.   MRN: [id number]  HPI 46 yo male in non acute distress, presents today with nasal congestion, ST, HA and mild cough. He states all is improving except his throat which has increasingly gotten worse.  Has a  light yellow  discharge from nose.  Blood pressure 104/70, pulse (!) 56, temperature 97.9 F (36.6 C), temperature source Tympanic, resp. rate 16, SpO2 98 %.  No Known Allergies   Review of Systems  Constitutional:  Negative for chills and fever.  HENT:  Positive for congestion, postnasal drip and sore throat. Negative for sinus pressure and sinus pain.   Respiratory:  Negative for cough (mild) and shortness of breath.   Cardiovascular:  Negative for chest pain.  Neurological:  Positive for headaches.      Objective:   Physical Exam Vitals and nursing note reviewed.  Constitutional:      Appearance: He is well-developed and normal weight.  HENT:     Head: Normocephalic and atraumatic.     Right Ear: Tympanic membrane and ear canal normal.     Left Ear: Tympanic membrane and ear canal normal.     Nose: No congestion.     Mouth/Throat:     Mouth: Mucous membranes are moist. Oral lesions present.     Pharynx: Uvula midline. Pharyngeal swelling, posterior oropharyngeal erythema and uvula swelling present. No oropharyngeal exudate.     Tonsils: No tonsillar exudate or tonsillar abscesses.  Eyes:     Conjunctiva/sclera: Conjunctivae normal.     Pupils: Pupils are equal, round, and reactive to light.  Cardiovascular:     Rate and Rhythm: Normal rate and regular rhythm.     Heart sounds: Normal heart sounds. No murmur heard.   No friction rub. No gallop.  Pulmonary:     Effort: Pulmonary effort is normal.     Breath sounds: Normal breath sounds.  Musculoskeletal:     Cervical back: Normal range of motion and neck supple.  Lymphadenopathy:     Cervical: No cervical adenopathy.  Skin:     General: Skin is warm and dry.  Neurological:     General: No focal deficit present.     Mental Status: He is alert and oriented to person, place, and time.  Psychiatric:        Mood and Affect: Mood normal.        Behavior: Behavior normal.          Assessment & Plan:  Pharyngitis Dilute salt water gargles , otc Motrin q 6 hours and Tylenol for break throught pain. Soft foods , no acidic foods like tomatoes or oranges.   Meds ordered this encounter  Medications   amoxicillin-clavulanate (AUGMENTIN) 875-125 MG tablet    Sig: Take 1 tablet by mouth 2 (two) times daily.    Dispense:  20 tablet    Refill:  0   Follow up in 3-5 days if not improving, patient verbalizes understanding and has no questions at discharge.

## 2021-11-09 ENCOUNTER — Telehealth: Payer: Self-pay | Admitting: Urology

## 2021-11-09 NOTE — Telephone Encounter (Signed)
Pt is scheduled to have a procedure tomorrow with Dr. Diamantina Providence and wanted to let you all know he is currently on his 8th day of Amoxicillan for a sore throat and wanted to know if that would interfere with anything. His call back number is [phone].

## 2021-11-09 NOTE — Telephone Encounter (Signed)
Pt is scheduled for a VAS tomorrow ok to proceed? Please advise.

## 2021-11-10 ENCOUNTER — Ambulatory Visit: Payer: BC Managed Care – PPO | Admitting: Urology

## 2021-11-10 ENCOUNTER — Other Ambulatory Visit: Payer: Self-pay

## 2021-11-10 ENCOUNTER — Encounter: Payer: Self-pay | Admitting: Urology

## 2021-11-10 VITALS — BP 123/74 | HR 59 | Ht 68.0 in | Wt 145.0 lb

## 2021-11-10 DIAGNOSIS — Z302 Encounter for sterilization: Secondary | ICD-10-CM

## 2021-11-10 NOTE — Patient Instructions (Signed)

## 2021-11-10 NOTE — Progress Notes (Signed)
VASECTOMY PROCEDURE NOTE:  The patient was taken to the minor procedure room and placed in the supine position. His genitals were prepped and draped in the usual sterile fashion. The right vas deferens was brought up to the skin of the right upper scrotum. The skin overlying it was anesthetized with 1% lidocaine without epinephrine, anesthetic was also injected alongside the vas deferens in the direction of the inguinal canal. The no scalpel vasectomy instrument was used to make a small perforation in the scrotal skin. The vasectomy clamp was used to grasp the vas deferens. It was carefully dissected free from surrounding structures. A 1cm segment of the vas was removed, and the cut ends of the mucosa were cauterized.  No significant bleeding was noted. The vas deferens was returned to the scrotum. The skin incision was closed with a simple interrupted stitch of 4-0 chromic.  Attention was then turned to the left side. The left vasectomy was performed in the same exact fashion. Sterile dressings were placed over each incision. The patient tolerated the procedure well.  IMPRESSION/DIAGNOSIS: The patient is a 46 year old gentleman who underwent a vasectomy today. Post-procedure instructions were reviewed. I stressed the importance of continuing to use birth control until he provides a semen specimen more than 2 months from now that demonstrates azoospermia.  We discussed return precautions including fever over 101, significant bleeding or hematoma, or uncontrolled pain. I also stressed the importance of avoiding strenuous activity for one week, no sexual activity or ejaculations for 5 days, intermittent icing over the next 48 hours, and scrotal support.   PLAN: The patient will be advised of his semen analysis results when available.

## 2022-02-12 ENCOUNTER — Other Ambulatory Visit: Payer: BC Managed Care – PPO

## 2022-02-12 DIAGNOSIS — Z302 Encounter for sterilization: Secondary | ICD-10-CM | POA: Diagnosis not present

## 2022-02-13 LAB — POST-VAS SPERM EVALUATION,QUAL: Volume: 2.6 mL

## 2022-02-16 ENCOUNTER — Ambulatory Visit: Payer: BC Managed Care – PPO | Admitting: Medical

## 2022-02-16 VITALS — BP 100/52 | HR 58 | Temp 98.0°F | Resp 16

## 2022-02-16 DIAGNOSIS — R2233 Localized swelling, mass and lump, upper limb, bilateral: Secondary | ICD-10-CM

## 2022-02-16 DIAGNOSIS — J3489 Other specified disorders of nose and nasal sinuses: Secondary | ICD-10-CM

## 2022-02-16 NOTE — Progress Notes (Signed)
   Subjective:    Patient ID: Randall Evans, male    DOB: 1976-02-27, 46 y.o.   MRN: [id number]  HPI 46 yo male in non acute distress. Presents today with 5 years with pressure in left maxillalry sinus next to nose. He says it swells ( on sthe outsside of his skin) and he has pressure and pain Usually occurs 1-2 times /year. Pain radiates to teeth. Also has nodules on 5th finger PIP left hand and 4th finger of right hand.Would like to see orthopedics for evaluation.   Review of Systems  Constitutional:  Negative for chills and fever.  HENT:  Positive for postnasal drip, rhinorrhea, sinus pressure, sinus pain and sore throat (worse in the morning).       Objective:   Physical Exam Vitals and nursing note reviewed.  Constitutional:      Appearance: Normal appearance.  HENT:     Head: Normocephalic and atraumatic.     Nose: Nose normal. No congestion or rhinorrhea.     Mouth/Throat:     Mouth: Mucous membranes are moist.     Pharynx: Oropharynx is clear.  Eyes:     Extraocular Movements: Extraocular movements intact.     Conjunctiva/sclera: Conjunctivae normal.     Pupils: Pupils are equal, round, and reactive to light.  Pulmonary:     Effort: Pulmonary effort is normal.  Musculoskeletal:     Cervical back: Normal range of motion and neck supple.  Skin:    General: Skin is warm and dry.  Neurological:     General: No focal deficit present.     Mental Status: He is alert and oriented to person, place, and time. Mental status is at baseline.  Psychiatric:        Mood and Affect: Mood normal.        Behavior: Behavior normal.        Thought Content: Thought content normal.        Judgment: Judgment normal.    Full  range of all fingers bilaterally. Noted nodule on the 5th finger  PIP of the left hand and 4th finger of the right hand.     Assessment & Plan:  Maxillary sinus pain  Nodules on fingers at PIP joints. Orders Placed This Encounter  Procedures   Ambulatory  referral to ENT    Referral Priority:   Routine    Referral Type:   Consultation    Referral Reason:   Specialty Services Required    Referred to Provider:   Carloyn Manner, MD    Requested Specialty:   Otolaryngology    Number of Visits Requested:   1   Ambulatory referral to Orthopedic Surgery    Referral Priority:   Routine    Referral Type:   Surgical    Referral Reason:   Specialty Services Required    Referred to Provider:   Thornton Park, MD    Requested Specialty:   Orthopedic Surgery    Number of Visits Requested:   1   Follow up with referrals. Patient verbalizes understanding and has no questions at discharge.

## 2022-02-17 ENCOUNTER — Encounter: Payer: Self-pay | Admitting: Medical

## 2022-03-16 DIAGNOSIS — M720 Palmar fascial fibromatosis [Dupuytren]: Secondary | ICD-10-CM | POA: Diagnosis not present

## 2022-03-31 ENCOUNTER — Encounter: Payer: Self-pay | Admitting: Medical

## 2022-04-05 ENCOUNTER — Other Ambulatory Visit: Payer: Self-pay | Admitting: Otolaryngology

## 2022-04-05 ENCOUNTER — Ambulatory Visit
Admission: RE | Admit: 2022-04-05 | Discharge: 2022-04-05 | Disposition: A | Discharge status: Home / Self Care | Payer: BC Managed Care – PPO | Source: Ambulatory Visit | Attending: Otolaryngology | Admitting: Otolaryngology

## 2022-04-05 DIAGNOSIS — R519 Headache, unspecified: Secondary | ICD-10-CM | POA: Diagnosis not present

## 2022-04-05 DIAGNOSIS — J301 Allergic rhinitis due to pollen: Secondary | ICD-10-CM | POA: Diagnosis not present

## 2022-04-06 ENCOUNTER — Other Ambulatory Visit: Payer: Self-pay

## 2022-06-03 ENCOUNTER — Ambulatory Visit (INDEPENDENT_AMBULATORY_CARE_PROVIDER_SITE_OTHER): Payer: BC Managed Care – PPO | Admitting: Physician Assistant

## 2022-06-03 ENCOUNTER — Encounter: Payer: Self-pay | Admitting: Physician Assistant

## 2022-06-03 VITALS — BP 122/68 | HR 66 | Temp 98.2°F | Wt 149.4 lb

## 2022-06-03 DIAGNOSIS — J069 Acute upper respiratory infection, unspecified: Secondary | ICD-10-CM

## 2022-06-03 LAB — POC COVID19 BINAXNOW: SARS Coronavirus 2 Ag: NEGATIVE

## 2022-06-03 NOTE — Progress Notes (Signed)
[facility] [address], [place] [address]   Office Visit Note  Patient Name: Randall Evans Date of Birth [id number]  Medical Record number [id number]  Date of Service: 06/03/2022  Chief Complaint  Patient presents with   URI    Pt stated it started Mon. Congested, head pressure and headache.Neg. COVID yesterday. Pt stated his wife and kids had a cold last week.      46 y/o M presents to the clinic for c/o sinus pain, pressure, post nasal drainage, nasal congestion x 3 days. He denies fever, CP, or SOB. Denies sore throat or body aches. +family members with similar symptoms and feeling better now. Had a negative covid test.Has been taking OTC Afrin and Claritin for his symptoms with minor relief.   URI  Associated symptoms include congestion, headaches (sinus headaches) and sinus pain. Pertinent negatives include no ear pain.      Current Medication:  Outpatient Encounter Medications as of 06/03/2022  Medication Sig   [DISCONTINUED] amoxicillin-clavulanate (AUGMENTIN) 875-125 MG tablet Take 1 tablet by mouth 2 (two) times daily.   No facility-administered encounter medications on file as of 06/03/2022.      Medical History: Past Medical History:  Diagnosis Date   Heart murmur    Childhood. Resolved.   Wears contact lenses      Vital Signs: BP 122/68 (BP Location: Left Arm, Patient Position: Sitting, Cuff Size: Normal)   Pulse 66   Temp 98.2 F (36.8 C) (Tympanic)   Wt 149 lb 6.4 oz (67.8 kg)   SpO2 97%   BMI 22.72 kg/m    Review of Systems  Constitutional: Negative.   HENT:  Positive for congestion, postnasal drip, sinus pressure and sinus pain. Negative for ear pain and trouble swallowing.   Respiratory: Negative.    Cardiovascular: Negative.   Neurological:  Positive for headaches (sinus headaches). Negative for dizziness and light-headedness.    Physical Exam Constitutional:      Appearance: Normal appearance.  HENT:     Head:  Atraumatic.     Right Ear: Tympanic membrane, ear canal and external ear normal.     Left Ear: Tympanic membrane, ear canal and external ear normal.     Nose: Congestion and rhinorrhea present.     Right Sinus: Maxillary sinus tenderness and frontal sinus tenderness present.     Left Sinus: Maxillary sinus tenderness and frontal sinus tenderness present.     Mouth/Throat:     Mouth: Mucous membranes are moist.     Pharynx: Oropharynx is clear.  Eyes:     Extraocular Movements: Extraocular movements intact.  Cardiovascular:     Rate and Rhythm: Normal rate and regular rhythm.  Pulmonary:     Effort: Pulmonary effort is normal.     Breath sounds: Normal breath sounds.  Musculoskeletal:     Cervical back: Neck supple.  Skin:    General: Skin is warm.  Neurological:     Mental Status: He is alert.  Psychiatric:        Mood and Affect: Mood normal.        Behavior: Behavior normal.        Thought Content: Thought content normal.        Judgment: Judgment normal.       Assessment/Plan:   ICD-10-CM   1. Viral upper respiratory tract infection  J06.9 POC COVID-19 [facility]Now      Reviewed negative covid test result with patient. He verbalized understanding.  Increase  fluids Start oral decongestant ie Sudafed as directed on the box tomorrow after finishing Afrin later today. Continue with Claritin as directed. Start Flonase nasal spray tomorrow. Continue to watch for worsening symptoms. Patient is leaving on a trip on Monday and if symptoms don't improve then we had discussed possible Rxs for oral steroids and oral antibiotic. Pt will contact us prior to his departure on Monday. He may schedule an appointment to come in and be seen if no symptom improvement on Monday. Pt verbalized understanding and in agreement.  Today's visit is a 25 minute F2F encounter.   General Counseling: Randall Evans understanding of the findings of todays visit and agrees with plan of treatment. I  have discussed any further diagnostic evaluation that may be needed or ordered today. We also reviewed his medications today. he has been encouraged to call the office with any questions or concerns that should arise related to todays visit.    Time spent:25 [place], [place] Physician Assistant

## 2022-06-16 ENCOUNTER — Encounter: Payer: Self-pay | Admitting: Nurse Practitioner

## 2022-06-16 ENCOUNTER — Ambulatory Visit (INDEPENDENT_AMBULATORY_CARE_PROVIDER_SITE_OTHER): Payer: BC Managed Care – PPO | Admitting: Nurse Practitioner

## 2022-06-16 VITALS — HR 62 | Temp 97.8°F

## 2022-06-16 DIAGNOSIS — J0100 Acute maxillary sinusitis, unspecified: Secondary | ICD-10-CM

## 2022-06-16 MED ORDER — AMOXICILLIN-POT CLAVULANATE 875-125 MG PO TABS: 1.0000 | ORAL_TABLET | Freq: Two times a day (BID) | ORAL | 0 refills | Status: AC

## 2022-06-16 NOTE — Progress Notes (Signed)
[facility] [address], [place] [address] [phone]  Office Visit Note  Patient Name: Randall Evans Date of Birth [id number]  Medical Record number [id number]  Date of Service: 06/16/2022  Chief Complaint  Patient presents with   Sinus Problem    Was seen on 9/14. Held off on antibiotics then. Came back from [place] on Friday. Mucus is green/yellowish, coughing. Tested 3 times neg. For COVID      HPI 46 year old male returning to [employer] with complaints of ongoing sinus congestion for the past 10+ days, he was getting better initially during travel to [place] but over the past weekend congestion moved into chest and is now having green productive mucous with cough.   Tested negative for COVID x3    Current Medication: None  Medical History: Past Medical History:  Diagnosis Date   Heart murmur    Childhood. Resolved.   Wears contact lenses      Vital Signs: Pulse 62   Temp 97.8 F (36.6 C) (Tympanic)   SpO2 98%    Review of Systems  Constitutional: Negative.   HENT:  Positive for congestion and sinus pressure.   Eyes: Negative.   Respiratory:  Positive for cough.   Cardiovascular: Negative.   Genitourinary: Negative.   Musculoskeletal: Negative.   Neurological: Negative.     Physical Exam HENT:     Head: Normocephalic.     Right Ear: A middle ear effusion is present.     Left Ear: A middle ear effusion is present.     Nose: Congestion and rhinorrhea present. Rhinorrhea is purulent.     Right Turbinates: Enlarged.     Left Turbinates: Enlarged.     Right Sinus: Maxillary sinus tenderness present.     Left Sinus: Maxillary sinus tenderness present.     Mouth/Throat:     Mouth: Mucous membranes are moist.     Pharynx: Oropharyngeal exudate present.     Tonsils: No tonsillar exudate.     Comments: PND noted Cardiovascular:     Rate and Rhythm: Normal rate and regular rhythm.     Heart sounds: Normal heart sounds.   Pulmonary:     Effort: Pulmonary effort is normal.     Breath sounds: Normal breath sounds.  Skin:    General: Skin is warm.  Neurological:     General: No focal deficit present.     Mental Status: He is alert.  Psychiatric:        Mood and Affect: Mood normal.       Assessment/Plan: 1. Acute non-recurrent maxillary sinusitis Advised Mucinex in the Day Nyquil at night  May switch to benzonatate if cough becomes dry  - amoxicillin-clavulanate (AUGMENTIN) 875-125 MG tablet; Take 1 tablet by mouth 2 (two) times daily for 7 days.  Dispense: 14 tablet; Refill: 0    General Counseling: Randall Evans understanding of the findings of todays visit and agrees with plan of treatment. I have discussed any further diagnostic evaluation that may be needed or ordered today. We also reviewed his medications today. he has been encouraged to call the office with any questions or concerns that should arise related to todays visit.    Time spent:15 [place] [facility] Family Nurse Practitioner

## 2022-10-28 DIAGNOSIS — Z08 Encounter for follow-up examination after completed treatment for malignant neoplasm: Secondary | ICD-10-CM | POA: Diagnosis not present

## 2022-10-28 DIAGNOSIS — D225 Melanocytic nevi of trunk: Secondary | ICD-10-CM | POA: Diagnosis not present

## 2022-10-28 DIAGNOSIS — L821 Other seborrheic keratosis: Secondary | ICD-10-CM | POA: Diagnosis not present

## 2022-10-28 DIAGNOSIS — Z85820 Personal history of malignant melanoma of skin: Secondary | ICD-10-CM | POA: Diagnosis not present

## 2022-11-08 ENCOUNTER — Encounter: Payer: Self-pay | Admitting: Physician Assistant

## 2022-11-08 ENCOUNTER — Ambulatory Visit (INDEPENDENT_AMBULATORY_CARE_PROVIDER_SITE_OTHER): Payer: BC Managed Care – PPO | Admitting: Physician Assistant

## 2022-11-08 ENCOUNTER — Ambulatory Visit: Payer: BC Managed Care – PPO | Admitting: Physician Assistant

## 2022-11-08 VITALS — BP 120/70 | HR 64 | Temp 98.3°F | Wt 152.2 lb

## 2022-11-08 DIAGNOSIS — J320 Chronic maxillary sinusitis: Secondary | ICD-10-CM

## 2022-11-08 NOTE — Progress Notes (Signed)
[facility] [address], [place] [address]   Office Visit Note  Patient Name: Randall Evans Date of Birth [id number]  Medical Record number [id number]  Date of Service: 11/08/2022  Chief Complaint  Patient presents with   Sinusitis    1.5 weeks head pressure, left side inside nose there is a sore, shooting pain into teeth. Went to ENT last year, ENT didn't see anything. Only pops up when sick. No Ear pair, no coughing, fever, BA, ST. Mucus is yellowish     47 y/o M presents to the clinic for c/o sinus pressure and pain over the left maxillary sinus. +"puffiness" over the left maxillary sinus. He denies sore throat, cough, CP, SOB, wheezing, body aches, or fever. No recent dental procedures.  Sinusitis Associated symptoms include congestion and sinus pressure. Pertinent negatives include no sore throat.      Current Medication:  No outpatient encounter medications on file as of 11/08/2022.   No facility-administered encounter medications on file as of 11/08/2022.      Medical History: Past Medical History:  Diagnosis Date   Heart murmur    Childhood. Resolved.   Wears contact lenses      Vital Signs: BP 120/70 (BP Location: Left Arm, Patient Position: Sitting, Cuff Size: Normal)   Pulse 64   Temp 98.3 F (36.8 C) (Tympanic)   Wt 152 lb 3.2 oz (69 kg)   SpO2 98%   BMI 23.14 kg/m    Review of Systems  Constitutional: Negative.   HENT:  Positive for congestion, sinus pressure and sinus pain. Negative for sore throat and trouble swallowing.   Eyes: Negative.   Respiratory: Negative.    Cardiovascular: Negative.   Neurological: Negative.     Physical Exam Constitutional:      Appearance: Normal appearance.  HENT:     Head: Atraumatic.     Right Ear: Tympanic membrane, ear canal and external ear normal.     Left Ear: Tympanic membrane, ear canal and external ear normal.     Ears:     Comments: TM appears dull L>R side.      Nose:      Right Turbinates: Not enlarged.     Left Turbinates: Not enlarged.     Left Sinus: Frontal sinus tenderness present.      Comments: + mild palpable swelling over the left maxillary sinus    Mouth/Throat:     Mouth: Mucous membranes are moist.     Pharynx: Oropharynx is clear.  Eyes:     Extraocular Movements: Extraocular movements intact.  Cardiovascular:     Rate and Rhythm: Normal rate and regular rhythm.  Pulmonary:     Effort: Pulmonary effort is normal.     Breath sounds: Normal breath sounds.  Musculoskeletal:     Cervical back: Neck supple.  Skin:    General: Skin is warm.  Neurological:     Mental Status: He is alert.  Psychiatric:        Mood and Affect: Mood normal.        Behavior: Behavior normal.        Thought Content: Thought content normal.        Judgment: Judgment normal.       Assessment/Plan:  1. Left maxillary sinusitis  Increase fluids Use Neti pot Take otc oral anti-histmaine ie Zyrtec or Allegra Flonase nasal spray Continue to watch for worsening symptoms. F/U with ENT as scheduled Briefly discussed oral antibiotic.  Pt  verbalized understanding and in agreement.   General Counseling: nial [place] understanding of the findings of todays visit and agrees with plan of treatment. I have discussed any further diagnostic evaluation that may be needed or ordered today. We also reviewed his medications today. he has been encouraged to call the office with any questions or concerns that should arise related to todays visit.    Time spent:20 [place], [place] Physician Assistant

## 2022-11-17 DIAGNOSIS — J3489 Other specified disorders of nose and nasal sinuses: Secondary | ICD-10-CM | POA: Diagnosis not present

## 2022-11-17 DIAGNOSIS — L040 Acute lymphadenitis of face, head and neck: Secondary | ICD-10-CM | POA: Diagnosis not present

## 2022-12-13 DIAGNOSIS — J3489 Other specified disorders of nose and nasal sinuses: Secondary | ICD-10-CM | POA: Diagnosis not present

## 2022-12-13 DIAGNOSIS — L040 Acute lymphadenitis of face, head and neck: Secondary | ICD-10-CM | POA: Diagnosis not present

## 2022-12-20 DIAGNOSIS — J019 Acute sinusitis, unspecified: Secondary | ICD-10-CM | POA: Diagnosis not present

## 2022-12-24 ENCOUNTER — Ambulatory Visit (INDEPENDENT_AMBULATORY_CARE_PROVIDER_SITE_OTHER): Payer: BC Managed Care – PPO | Admitting: Adult Health

## 2022-12-24 DIAGNOSIS — J0110 Acute frontal sinusitis, unspecified: Secondary | ICD-10-CM

## 2022-12-24 DIAGNOSIS — S29011A Strain of muscle and tendon of front wall of thorax, initial encounter: Secondary | ICD-10-CM

## 2022-12-24 NOTE — Progress Notes (Signed)
[facility] [address] [place], [place] [address]   Office Visit Note  Patient Name: Randall Evans Date of Birth [id number]  Medical Record number [id number]  Date of Service: 12/24/2022  No chief complaint on file.    HPI Pt is here for a sick visit. He reports for about 10-14 days he has been having intermittent sinus/cold symptoms.  He reports intermittent sore throat.  About 5 days ago he went to urgent care because he was having green mucous.  He was given Amoxicillin. He seems to be improving. He wants to have that checked.  Then last week on the way to the beach he ate bojangles and had some chest pain, that felt like reflux.  He has noticed some chest tightness/discomfort that he notices with movement.  He does recall a moment of twisting in the care to help his daughter who was car sick.     Current Medication:  No outpatient encounter medications on file as of 12/24/2022.   No facility-administered encounter medications on file as of 12/24/2022.      Medical History: Past Medical History:  Diagnosis Date   Heart murmur    Childhood. Resolved.   Wears contact lenses      Vital Signs: There were no vitals taken for this visit.   Review of Systems  Constitutional:  Negative for chills, fatigue and fever.  HENT:  Positive for congestion, postnasal drip and sore throat. Negative for sinus pressure and sinus pain.   Eyes:  Negative for pain and itching.  Respiratory:  Positive for chest tightness. Negative for cough.   Cardiovascular:  Positive for chest pain. Negative for palpitations and leg swelling.  Gastrointestinal:  Negative for diarrhea, nausea and vomiting.    Physical Exam Vitals and nursing note reviewed.  Constitutional:      Appearance: Normal appearance.  HENT:     Head: Normocephalic.     Right Ear: Tympanic membrane and ear canal normal.     Left Ear: Tympanic membrane and ear canal normal.     Nose: Congestion present.      Mouth/Throat:     Mouth: Mucous membranes are moist.     Pharynx: Oropharynx is clear.     Comments: PND in pharynx Pulmonary:     Effort: Pulmonary effort is normal.     Breath sounds: Normal breath sounds.  Musculoskeletal:       Arms:     Comments: Tenderness with palpation over left Pectoral  Lymphadenopathy:     Cervical: No cervical adenopathy.  Neurological:     Mental Status: He is alert.     Assessment/Plan: 1. Chest wall muscle strain, initial encounter Discussed resting muscle.  No lifting or working out other than walking.  Discussed heat, and ibuprofen.  Follow up via MyChart messenger if symptoms fail to improve or may return to clinic as needed for worsening symptoms.    2. Acute non-recurrent frontal sinusitis Continue current management.      General Counseling: brodus locke understanding of the findings of todays visit and agrees with plan of treatment. I have discussed any further diagnostic evaluation that may be needed or ordered today. We also reviewed his medications today. he has been encouraged to call the office with any questions or concerns that should arise related to todays visit.   No orders of the defined types were placed in this encounter.   No orders of the defined types were placed in this encounter.  Time spent:20 Minutes    Kendell Bane AGNP-C Nurse Practitioner

## 2022-12-27 ENCOUNTER — Encounter: Payer: Self-pay | Admitting: Family Medicine

## 2022-12-27 ENCOUNTER — Ambulatory Visit: Payer: BC Managed Care – PPO | Admitting: Family Medicine

## 2022-12-27 VITALS — BP 120/80 | HR 56 | Ht 68.0 in | Wt 155.0 lb

## 2022-12-27 DIAGNOSIS — C439 Malignant melanoma of skin, unspecified: Secondary | ICD-10-CM

## 2022-12-27 DIAGNOSIS — J3089 Other allergic rhinitis: Secondary | ICD-10-CM

## 2022-12-27 DIAGNOSIS — M7592 Shoulder lesion, unspecified, left shoulder: Secondary | ICD-10-CM | POA: Diagnosis not present

## 2022-12-27 DIAGNOSIS — M7522 Bicipital tendinitis, left shoulder: Secondary | ICD-10-CM | POA: Insufficient documentation

## 2022-12-27 DIAGNOSIS — K648 Other hemorrhoids: Secondary | ICD-10-CM

## 2022-12-27 DIAGNOSIS — J309 Allergic rhinitis, unspecified: Secondary | ICD-10-CM | POA: Insufficient documentation

## 2022-12-27 NOTE — Assessment & Plan Note (Signed)
Chronic, ongoing symptomatology primarily involving the left maxillary region, has established with ENT.  Plan as follows: - Maintain Flonase and Claritin regimen - Persistent symptoms to be reviewed with ENT for further evaluation and management - Can contact us for any acute worsening for interim measures

## 2022-12-27 NOTE — Assessment & Plan Note (Signed)
Previous issue, has since been stable/resolved following lifestyle changes.

## 2022-12-27 NOTE — Patient Instructions (Addendum)
-   Continue Flonase and Claritin on a regular basis - Consider limiting upper body activity to what physical therapy has prescribed - Return for annual physical in 1 month

## 2022-12-27 NOTE — Progress Notes (Signed)
     Primary Care / Sports Medicine Office Visit  Patient Information:  Patient ID: Randall Evans, male DOB: [birth year] Age: 47 y.o. MRN: [id number]   Randall Evans is a pleasant 48 y.o. male presenting with the following:  Chief Complaint  Patient presents with   Establish Care    Vitals:   12/27/22 0902  BP: 120/80  Pulse: (!) 56  SpO2: 99%   Vitals:   12/27/22 0902  Weight: 155 lb (70.3 kg)  Height: 5\' 8"  (1.727 m)   Body mass index is 23.57 kg/m.  No results found.   Independent interpretation of notes and tests performed by another provider:   None  Procedures performed:   None  Pertinent History, Exam, Impression, and Recommendations:   Randall Evans was seen today for establish care.  Left supraspinatus tendinitis Assessment & Plan: Ambidextrous patient presenting with atraumatic left shoulder discomfort with overhead and reaching behind activities.  Denies overt weakness, has established with physical therapy at [facility].  Examination with 5/5 rotator cuff strength, positive empty can, equivocal impingement, tenderness at the biceps tendon, positive speeds, negative Yergason's.  Plan as follows: - Continue with PT - Advised activity modification while in the midst of PT - Can reevaluate in 4 weeks at return - Suboptimal progress to be addressed with pharmacotherapy, consideration of imaging   Biceps tendinitis of left shoulder Assessment & Plan: See additional assessment(s) for plan details.   Melanoma of skin Overview: Status post excision, sentinel node biopsy, establish with dermatology for annual skin checks.   Internal hemorrhoid Overview: Resolved following lifestyle modifications  Assessment & Plan: Previous issue, has since been stable/resolved following lifestyle changes.   Seasonal allergic rhinitis due to other allergic trigger Overview: ENT Dr. Geanie Logan Current regimen: Flonase, Claritin  Assessment &  Plan: Chronic, ongoing symptomatology primarily involving the left maxillary region, has established with ENT.  Plan as follows: - Maintain Flonase and Claritin regimen - Persistent symptoms to be reviewed with ENT for further evaluation and management - Can contact us for any acute worsening for interim measures    I provided a total time of 53 minutes including both face-to-face and non-face-to-face time on 12/27/2022 inclusive of time utilized for medical chart review, information gathering, care coordination with staff, and documentation completion.   Orders & Medications No orders of the defined types were placed in this encounter.  No orders of the defined types were placed in this encounter.    Return in about 4 weeks (around 01/24/2023) for CPE.     Jerrol Banana, MD, [facility]   Primary Care Sports Medicine Primary Care and Sports Medicine at [facility]

## 2022-12-27 NOTE — Assessment & Plan Note (Signed)
See additional assessment(s) for plan details. 

## 2022-12-27 NOTE — Assessment & Plan Note (Signed)
Ambidextrous patient presenting with atraumatic left shoulder discomfort with overhead and reaching behind activities.  Denies overt weakness, has established with physical therapy at [facility].  Examination with 5/5 rotator cuff strength, positive empty can, equivocal impingement, tenderness at the biceps tendon, positive speeds, negative Yergason's.  Plan as follows: - Continue with PT - Advised activity modification while in the midst of PT - Can reevaluate in 4 weeks at return - Suboptimal progress to be addressed with pharmacotherapy, consideration of imaging

## 2023-01-19 ENCOUNTER — Telehealth: Payer: Self-pay | Admitting: Family Medicine

## 2023-01-19 NOTE — Telephone Encounter (Signed)
Pt is calling in because he works for [employer] and they have a place onsite through [employer] that does labs and testing. Pt wants to know can he get his blood work done there and have it sent over to the office before his appointment or would he need to have it done at the office? Please advise.

## 2023-01-20 NOTE — Telephone Encounter (Signed)
Please advise 

## 2023-01-20 NOTE — Telephone Encounter (Signed)
We can address this during his visit tomorrow.

## 2023-01-24 ENCOUNTER — Ambulatory Visit (INDEPENDENT_AMBULATORY_CARE_PROVIDER_SITE_OTHER): Payer: BC Managed Care – PPO | Admitting: Family Medicine

## 2023-01-24 ENCOUNTER — Encounter: Payer: Self-pay | Admitting: Family Medicine

## 2023-01-24 VITALS — BP 108/68 | HR 80 | Ht 68.0 in | Wt 150.0 lb

## 2023-01-24 DIAGNOSIS — Z13220 Encounter for screening for lipoid disorders: Secondary | ICD-10-CM

## 2023-01-24 DIAGNOSIS — Z0000 Encounter for general adult medical examination without abnormal findings: Secondary | ICD-10-CM | POA: Diagnosis not present

## 2023-01-24 DIAGNOSIS — Z1159 Encounter for screening for other viral diseases: Secondary | ICD-10-CM

## 2023-01-24 DIAGNOSIS — M7592 Shoulder lesion, unspecified, left shoulder: Secondary | ICD-10-CM

## 2023-01-24 DIAGNOSIS — C439 Malignant melanoma of skin, unspecified: Secondary | ICD-10-CM

## 2023-01-24 DIAGNOSIS — J309 Allergic rhinitis, unspecified: Secondary | ICD-10-CM

## 2023-01-24 DIAGNOSIS — E559 Vitamin D deficiency, unspecified: Secondary | ICD-10-CM

## 2023-01-24 DIAGNOSIS — Z114 Encounter for screening for human immunodeficiency virus [HIV]: Secondary | ICD-10-CM | POA: Diagnosis not present

## 2023-01-24 DIAGNOSIS — M7522 Bicipital tendinitis, left shoulder: Secondary | ICD-10-CM

## 2023-01-24 HISTORY — DX: Encounter for general adult medical examination without abnormal findings: Z00.00

## 2023-01-24 NOTE — Progress Notes (Signed)
Annual Physical Exam Visit  Patient Information:  Patient ID: Randall Evans, male DOB: 05/17/1976 Age: 47 y.o. MRN: [id number]   Subjective:   CC: Annual Physical Exam  HPI:  Randall Evans is here for their annual physical.  I reviewed the past medical history, family history, social history, surgical history, and allergies today and changes were made as necessary.  Please see the problem list section below for additional details.  Past Medical History: Past Medical History:  Diagnosis Date   Allergy    Seasonal Allergies   Cancer (HCC) 2011   Skin Melanoma, established with dermatologist   Cataract Surgery late 2021   Left eye   Heart murmur 1990   Childhood. Resolved. Did establish with cardiology, cleared.   Internal hemorrhoid    Wears contact lenses    Past Surgical History: Past Surgical History:  Procedure Laterality Date   COLONOSCOPY WITH PROPOFOL N/A 07/03/2018   Procedure: COLONOSCOPY WITH PROPOFOL;  Surgeon: Midge Minium, MD;  Location: [facility];  Service: Endoscopy;  Laterality: N/A;   EYE SURGERY  2021   Cataract   FLEXIBLE SIGMOIDOSCOPY N/A 11/03/2020   Procedure: FLEXIBLE SIGMOIDOSCOPY;  Surgeon: Toney Reil, MD;  Location: [facility];  Service: Gastroenterology;  Laterality: N/A;   MANDIBLE SURGERY  1995   corrective with hardware   MELANOMA EXCISION  2011   chest, followed by sentinel node evaluation and excision   VASECTOMY  2022   Family History: Family History  Problem Relation Age of Onset   Asthma Mother    Hypertension Mother    Osteoarthritis Mother    Depression Father    Diabetes Father        prediabetes   Stroke Maternal Grandmother    Cancer Maternal Grandmother        breast   Hypertension Maternal Grandmother    Diabetes Paternal Grandmother    Depression Paternal Grandmother    Heart disease Paternal Grandmother    Diabetes Paternal Grandfather    Heart disease Paternal Grandfather     Stroke Paternal Grandfather    Hypertension Paternal Grandfather    Allergies: No Known Allergies Health Maintenance: Health Maintenance  Topic Date Due   HIV Screening  Never done   Hepatitis C Screening  Never done   COVID-19 Vaccine (3 - Pfizer risk series) 01/16/2020   INFLUENZA VACCINE  04/21/2023   COLONOSCOPY (Pts 45-38yrs Insurance coverage will need to be confirmed)  07/03/2028   DTaP/Tdap/Td (2 - Tdap) 02/05/2030   HPV VACCINES  Aged Out    HM Colonoscopy          COLONOSCOPY (Pts 45-70yrs Insurance coverage will need to be confirmed) (Every 10 Years) Next due on 07/03/2028    07/03/2018  COLONOSCOPY   Only the first 1 history entries have been loaded, but more history exists.           Medications: No current outpatient medications on file prior to visit.   No current facility-administered medications on file prior to visit.    Review of Systems: No headache, visual changes, nausea, vomiting, diarrhea, constipation, dizziness, abdominal pain, skin rash, fevers, chills, night sweats, swollen lymph nodes, weight loss, chest pain, body aches, joint swelling, muscle aches, shortness of breath, mood changes, visual or auditory hallucinations reported.  Objective:   Vitals:   01/24/23 1358  BP: 108/68  Pulse: 80  SpO2: 99%   Vitals:   01/24/23 1358  Weight: 150 lb (68 kg)  Height: 5\' 8"  (1.727 m)   Body mass index is 22.81 kg/m.  General: Well Developed, well nourished, and in no acute distress.  Neuro: Alert and oriented x3, extra-ocular muscles intact, sensation grossly intact. Cranial nerves II through XII are grossly intact, motor, sensory, and coordinative functions are intact. HEENT: Normocephalic, atraumatic, pupils equal round reactive to light, neck supple, no masses, no lymphadenopathy, thyroid nonpalpable. Oropharynx, nasopharynx, external ear canals are unremarkable. Skin: Warm and dry, no rashes noted.  Cardiac: Regular rate and rhythm, no  murmurs rubs or gallops. No peripheral edema. Pulses symmetric. Respiratory: Clear to auscultation bilaterally. Not using accessory muscles, speaking in full sentences.  Abdominal: Soft, nontender, nondistended, positive bowel sounds, no masses, no organomegaly. Musculoskeletal: Shoulder, elbow, wrist, hip, knee, ankle stable, and with full range of motion.   Impression and Recommendations:   The patient was counselled, risk factors were discussed, and anticipatory guidance given.  Problem List Items Addressed This Visit       Respiratory   Allergic rhinitis due to allergen (Chronic)    Chronic, well-controlled on current regimen.        Musculoskeletal and Integument   Biceps tendinitis of left shoulder    See additional assessment(s) for plan details.      Left supraspinatus tendinitis    Excellent interim improvement after starting with physical therapy at [facility], no interference with ADLs, no nighttime pain.  Plan as follows: - Finish out remaining PT and transition to fully home-based maintenance program - Follow-up as needed      Melanoma of skin (HCC)    Follows with dermatology      Other Visit Diagnoses     Healthcare maintenance    -  Primary   Relevant Orders   CBC   Comprehensive metabolic panel   HIV Antibody (routine testing w rflx)   TSH   Lipid panel   VITAMIN D 25 Hydroxy (Vit-D Deficiency, Fractures)   Hepatitis C antibody   Vitamin D deficiency       Relevant Orders   VITAMIN D 25 Hydroxy (Vit-D Deficiency, Fractures)   Annual physical exam       Relevant Orders   CBC   Comprehensive metabolic panel   HIV Antibody (routine testing w rflx)   TSH   Lipid panel   VITAMIN D 25 Hydroxy (Vit-D Deficiency, Fractures)   Hepatitis C antibody   Screening for lipoid disorders       Relevant Orders   Comprehensive metabolic panel   Lipid panel   Screening for HIV (human immunodeficiency virus)       Relevant Orders   HIV Antibody (routine testing w  rflx)   Need for hepatitis C screening test       Relevant Orders   Hepatitis C antibody        Orders & Medications Medications: No orders of the defined types were placed in this encounter.  Orders Placed This Encounter  Procedures   CBC   Comprehensive metabolic panel   HIV Antibody (routine testing w rflx)   TSH   Lipid panel   VITAMIN D 25 Hydroxy (Vit-D Deficiency, Fractures)   Hepatitis C antibody     Return in about 1 year (around 01/24/2024) for CPE.    Jerrol Banana, MD, [facility]   Primary Care Sports Medicine Primary Care and Sports Medicine at [facility]

## 2023-01-24 NOTE — Assessment & Plan Note (Signed)
Follows with dermatology 

## 2023-01-24 NOTE — Assessment & Plan Note (Signed)
Chronic, well controlled on current regimen.  °

## 2023-01-24 NOTE — Assessment & Plan Note (Signed)
Excellent interim improvement after starting with physical therapy at [facility], no interference with ADLs, no nighttime pain.  Plan as follows: - Finish out remaining PT and transition to fully home-based maintenance program - Follow-up as needed

## 2023-01-24 NOTE — Patient Instructions (Signed)
-   Obtain fasting labs with orders provided (can have water or black coffee but otherwise no food or drink x 8 hours before labs) °- Review information provided °- Attend eye doctor annually, dentist every 6 months, work towards or maintain 30 minutes of moderate intensity physical activity at least 5 days per week, and consume a balanced diet °- Return in 1 year for physical °- Contact us for any questions between now and then °

## 2023-01-24 NOTE — Assessment & Plan Note (Signed)
See additional assessment(s) for plan details. 

## 2023-02-02 ENCOUNTER — Ambulatory Visit (INDEPENDENT_AMBULATORY_CARE_PROVIDER_SITE_OTHER): Payer: Self-pay | Admitting: Adult Health

## 2023-02-02 VITALS — HR 59 | Temp 96.5°F

## 2023-02-02 DIAGNOSIS — L237 Allergic contact dermatitis due to plants, except food: Secondary | ICD-10-CM

## 2023-02-02 MED ORDER — PREDNISONE 10 MG PO TABS: ORAL_TABLET | ORAL | 0 refills | Status: AC

## 2023-02-02 NOTE — Progress Notes (Signed)
[facility] [address] [place], [place] [address]   Office Visit Note  Patient Name: Randall Evans Date of Birth [id number]  Medical Record number [id number]  Date of Service: 02/02/2023  Chief Complaint  Patient presents with   Poison Ivy     HPI Pt is here for a sick visit. He reports over the weekend he was working on his yard fence.  The next day he woke up with a rash on left wrist.  He also noticed a small spot on his right knee.    Current Medication:  Outpatient Encounter Medications as of 02/02/2023  Medication Sig   predniSONE (DELTASONE) 10 MG tablet Take 6 tablets (60 mg total) by mouth daily with breakfast for 1 day, THEN 5 tablets (50 mg total) daily with breakfast for 1 day, THEN 4 tablets (40 mg total) daily with breakfast for 1 day, THEN 3 tablets (30 mg total) daily with breakfast for 1 day, THEN 2 tablets (20 mg total) daily with breakfast for 1 day, THEN 1 tablet (10 mg total) daily with breakfast for 1 day.   No facility-administered encounter medications on file as of 02/02/2023.      Medical History: Past Medical History:  Diagnosis Date   Allergy    Seasonal Allergies   Cancer (HCC) 2011   Skin Melanoma, established with dermatologist   Cataract Surgery late 2021   Left eye   Healthcare maintenance 01/24/2023   Heart murmur 1990   Childhood. Resolved. Did establish with cardiology, cleared.   Internal hemorrhoid    Wears contact lenses      Vital Signs: Pulse (!) 59   Temp (!) 96.5 F (35.8 C)   SpO2 98%    Review of Systems  Constitutional:  Negative for chills, fatigue and fever.  Skin:  Positive for rash.    Physical Exam Vitals and nursing note reviewed.  Constitutional:      Appearance: Normal appearance.  Skin:      Neurological:     Mental Status: He is alert.    Assessment/Plan: 1. Poison ivy dermatitis Take prednisone oral as prescribed, starting tomorrow. May continue to use topical anti-itch cream  like calamine lotion. Follow up via MyChart messenger if symptoms fail to improve or may return to clinic as needed for worsening symptoms.   - predniSONE (DELTASONE) 10 MG tablet; Take 6 tablets (60 mg total) by mouth daily with breakfast for 1 day, THEN 5 tablets (50 mg total) daily with breakfast for 1 day, THEN 4 tablets (40 mg total) daily with breakfast for 1 day, THEN 3 tablets (30 mg total) daily with breakfast for 1 day, THEN 2 tablets (20 mg total) daily with breakfast for 1 day, THEN 1 tablet (10 mg total) daily with breakfast for 1 day.  Dispense: 21 tablet; Refill: 0     General Counseling: stylianos hayes understanding of the findings of todays visit and agrees with plan of treatment. I have discussed any further diagnostic evaluation that may be needed or ordered today. We also reviewed his medications today. he has been encouraged to call the office with any questions or concerns that should arise related to todays visit.   No orders of the defined types were placed in this encounter.   Meds ordered this encounter  Medications   predniSONE (DELTASONE) 10 MG tablet    Sig: Take 6 tablets (60 mg total) by mouth daily with breakfast for 1 day, THEN 5 tablets (50 mg  total) daily with breakfast for 1 day, THEN 4 tablets (40 mg total) daily with breakfast for 1 day, THEN 3 tablets (30 mg total) daily with breakfast for 1 day, THEN 2 tablets (20 mg total) daily with breakfast for 1 day, THEN 1 tablet (10 mg total) daily with breakfast for 1 day.    Dispense:  21 tablet    Refill:  0    Time spent:20 Minutes    Johnna Acosta AGNP-C Nurse Practitioner

## 2023-02-07 ENCOUNTER — Encounter: Payer: Self-pay | Admitting: Adult Health

## 2023-02-07 ENCOUNTER — Other Ambulatory Visit: Payer: Self-pay | Admitting: Adult Health

## 2023-02-07 ENCOUNTER — Other Ambulatory Visit: Payer: Self-pay

## 2023-02-07 DIAGNOSIS — E559 Vitamin D deficiency, unspecified: Secondary | ICD-10-CM

## 2023-02-07 DIAGNOSIS — Z1159 Encounter for screening for other viral diseases: Secondary | ICD-10-CM

## 2023-02-07 DIAGNOSIS — Z114 Encounter for screening for human immunodeficiency virus [HIV]: Secondary | ICD-10-CM

## 2023-02-07 DIAGNOSIS — Z0000 Encounter for general adult medical examination without abnormal findings: Secondary | ICD-10-CM

## 2023-02-07 DIAGNOSIS — Z13220 Encounter for screening for lipoid disorders: Secondary | ICD-10-CM

## 2023-02-07 MED ORDER — TRIAMCINOLONE ACETONIDE 0.1 % EX CREA
1.0000 | TOPICAL_CREAM | Freq: Two times a day (BID) | CUTANEOUS | 0 refills | Status: DC
Start: 2023-02-07 — End: 2023-08-11

## 2023-02-08 ENCOUNTER — Other Ambulatory Visit: Payer: BC Managed Care – PPO

## 2023-02-08 ENCOUNTER — Other Ambulatory Visit: Payer: Self-pay | Admitting: *Deleted

## 2023-02-08 DIAGNOSIS — E559 Vitamin D deficiency, unspecified: Secondary | ICD-10-CM

## 2023-02-08 DIAGNOSIS — Z1159 Encounter for screening for other viral diseases: Secondary | ICD-10-CM

## 2023-02-08 DIAGNOSIS — Z0000 Encounter for general adult medical examination without abnormal findings: Secondary | ICD-10-CM

## 2023-02-08 DIAGNOSIS — Z13220 Encounter for screening for lipoid disorders: Secondary | ICD-10-CM

## 2023-02-08 DIAGNOSIS — Z114 Encounter for screening for human immunodeficiency virus [HIV]: Secondary | ICD-10-CM

## 2023-02-09 LAB — CBC
Hematocrit: 42.3 % (ref 37.5–51.0)
Hemoglobin: 15 g/dL (ref 13.0–17.7)
MCHC: 35.5 g/dL (ref 31.5–35.7)
Platelets: 202 10*3/uL (ref 150–450)
RBC: 4.61 x10E6/uL (ref 4.14–5.80)
WBC: 5.5 10*3/uL (ref 3.4–10.8)

## 2023-02-09 LAB — COMPREHENSIVE METABOLIC PANEL

## 2023-02-09 LAB — HIV ANTIBODY (ROUTINE TESTING W REFLEX): HIV Screen 4th Generation wRfx: NONREACTIVE

## 2023-02-09 LAB — LIPID PANEL

## 2023-02-10 LAB — COMPREHENSIVE METABOLIC PANEL
ALT: 20 IU/L (ref 0–44)
AST: 17 IU/L (ref 0–40)
Albumin/Globulin Ratio: 2 (ref 1.2–2.2)
Albumin: 4.6 g/dL (ref 4.1–5.1)
BUN/Creatinine Ratio: 15 (ref 9–20)
BUN: 14 mg/dL (ref 6–24)
Bilirubin Total: 0.6 mg/dL (ref 0.0–1.2)
CO2: 26 mmol/L (ref 20–29)
Chloride: 100 mmol/L (ref 96–106)
Creatinine, Ser: 0.94 mg/dL (ref 0.76–1.27)
Globulin, Total: 2.3 g/dL (ref 1.5–4.5)
Glucose: 80 mg/dL (ref 70–99)
Potassium: 3.7 mmol/L (ref 3.5–5.2)
eGFR: 102 mL/min/{1.73_m2} (ref >59)

## 2023-02-10 LAB — LIPID PANEL
Chol/HDL Ratio: 2.9 ratio (ref 0.0–5.0)
Cholesterol, Total: 215 mg/dL — ABNORMAL HIGH (ref 100–199)
LDL Chol Calc (NIH): 127 mg/dL — ABNORMAL HIGH (ref 0–99)
Triglycerides: 81 mg/dL (ref 0–149)
VLDL Cholesterol Cal: 14 mg/dL (ref 5–40)

## 2023-02-10 LAB — VITAMIN D 25 HYDROXY (VIT D DEFICIENCY, FRACTURES): Vit D, 25-Hydroxy: 21.2 ng/mL — ABNORMAL LOW (ref 30.0–100.0)

## 2023-02-10 LAB — HEPATITIS C ANTIBODY: Hep C Virus Ab: NONREACTIVE

## 2023-02-10 LAB — CBC
MCH: 32.5 pg (ref 26.6–33.0)
MCV: 92 fL (ref 79–97)
RDW: 12.2 % (ref 11.6–15.4)

## 2023-02-10 LAB — TSH: TSH: 1.07 u[IU]/mL (ref 0.450–4.500)

## 2023-02-15 ENCOUNTER — Other Ambulatory Visit: Payer: Self-pay | Admitting: Family Medicine

## 2023-02-15 MED ORDER — VITAMIN D (ERGOCALCIFEROL) 1.25 MG (50000 UNIT) PO CAPS
50000.0000 [IU] | ORAL_CAPSULE | ORAL | 0 refills | Status: DC
Start: 2023-02-15 — End: 2023-08-11

## 2023-07-26 DIAGNOSIS — Z23 Encounter for immunization: Secondary | ICD-10-CM | POA: Diagnosis not present

## 2023-08-11 ENCOUNTER — Encounter: Payer: Self-pay | Admitting: Adult Health

## 2023-08-11 ENCOUNTER — Ambulatory Visit (INDEPENDENT_AMBULATORY_CARE_PROVIDER_SITE_OTHER): Payer: Self-pay | Admitting: Adult Health

## 2023-08-11 ENCOUNTER — Other Ambulatory Visit: Payer: Self-pay

## 2023-08-11 VITALS — BP 120/84 | HR 52 | Temp 96.5°F | Ht 68.0 in | Wt 150.0 lb

## 2023-08-11 DIAGNOSIS — M79672 Pain in left foot: Secondary | ICD-10-CM

## 2023-08-11 DIAGNOSIS — R198 Other specified symptoms and signs involving the digestive system and abdomen: Secondary | ICD-10-CM

## 2023-08-11 NOTE — Progress Notes (Signed)
[facility] [address] [place], [place] [address]   Office Visit Note  Patient Name: Randall Evans Date of Birth [id number]  Medical Record number [id number]  Date of Service: 08/11/2023  Chief Complaint  Patient presents with   Acute Visit    Patient c/o left foot pain x 1 month since having his foot hit by a door. Symptoms have been intermittent but he gets a burning sensation on occasion where his foot ws injured. He is still able to run/walk without difficulty. Patient also is c/o soft or loose stools x 3 weeks. He has not made any recent changes to his diet. Denies abdominal pain/bloating and n/v.     HPI Pt is here for a sick visit. He reports his left foot was hit on the outside by a door.  This happened a few months ago.  Since then when he gets in certain positives, he presses on it, it give him a burning sensation.  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  Also he reports he has been having 3 weeks of soft stool.  No diet changes. Denies bloody or black stool.   Current Medication:  Outpatient Encounter Medications as of 08/11/2023  Medication Sig   Multiple Vitamin (MULTIVITAMIN) capsule Take 1 capsule by mouth daily.   [DISCONTINUED] triamcinolone cream (KENALOG) 0.1 % Apply 1 Application topically 2 (two) times daily.   [DISCONTINUED] Vitamin D, Ergocalciferol, (DRISDOL) 1.25 MG (50000 UNIT) CAPS capsule Take 1 capsule (50,000 Units total) by mouth every 7 (seven) days. Take for 8 total doses(weeks)   No facility-administered encounter medications on file as of 08/11/2023.      Medical History: Past Medical History:  Diagnosis Date   Allergy    Seasonal Allergies   Cancer (HCC) 2011   Skin Melanoma, established with dermatologist   Cataract Surgery late 2021   Left eye   Healthcare maintenance 01/24/2023   Heart murmur 1990   Childhood. Resolved. Did establish with cardiology, cleared.   Internal hemorrhoid     Wears contact lenses      Vital Signs: BP 120/84   Pulse (!) 52   Temp (!) 96.5 F (35.8 C)   Ht 5\' 8"  (1.727 m)   Wt 150 lb (68 kg)   SpO2 99%   BMI 22.81 kg/m    Review of Systems  Constitutional:  Negative for chills, fatigue and fever.  Eyes:  Negative for pain and itching.  Gastrointestinal:  Negative for abdominal distention, abdominal pain, anal bleeding, blood in stool, constipation, nausea, rectal pain and vomiting.  Musculoskeletal:  Positive for arthralgias.    Physical Exam Vitals reviewed.  Constitutional:      Appearance: Normal appearance.  HENT:     Head: Normocephalic.     Right Ear: Tympanic membrane and ear canal normal.     Left Ear: Tympanic membrane and ear canal normal.     Nose: Nose normal.  Pulmonary:     Effort: Pulmonary effort is normal.  Musculoskeletal:       Feet:  Feet:     Comments: Tenderness at marked area on diagram.  Lymphadenopathy:     Cervical: No cervical adenopathy.  Neurological:     Mental Status: He is alert.    Assessment/Plan: 1. Foot pain, left Be mindful of positions that feet rest in, I.e. crossed legs etc.  May try advil, follow up as discussed.   2. Change in bowel movement  Likely from super clean diet and water intake.  However, continue to monitor situation, and follow up as needed. Discussed cutting back on fiber or fluid intake.         General Counseling: nachmen guterrez understanding of the findings of todays visit and agrees with plan of treatment. I have discussed any further diagnostic evaluation that may be needed or ordered today. We also reviewed his medications today. he has been encouraged to call the office with any questions or concerns that should arise related to todays visit.   No orders of the defined types were placed in this encounter.   No orders of the defined types were placed in this encounter.   Time spent:15 Minutes    Johnna Acosta AGNP-C Nurse Practitioner

## 2023-09-28 ENCOUNTER — Encounter: Payer: Self-pay | Admitting: Adult Health

## 2023-09-28 ENCOUNTER — Other Ambulatory Visit: Payer: Self-pay

## 2023-09-28 ENCOUNTER — Ambulatory Visit (INDEPENDENT_AMBULATORY_CARE_PROVIDER_SITE_OTHER): Payer: Self-pay | Admitting: Adult Health

## 2023-09-28 VITALS — BP 110/80 | HR 63 | Temp 97.4°F | Ht 68.0 in | Wt 150.0 lb

## 2023-09-28 DIAGNOSIS — J0110 Acute frontal sinusitis, unspecified: Secondary | ICD-10-CM

## 2023-09-28 MED ORDER — AMOXICILLIN-POT CLAVULANATE 875-125 MG PO TABS
1.0000 | ORAL_TABLET | Freq: Two times a day (BID) | ORAL | 0 refills | Status: DC
Start: 2023-09-28 — End: 2023-12-22

## 2023-09-28 NOTE — Progress Notes (Signed)
 [facility] [address] [place], [place] [address]   Office Visit Note  Patient Name: Randall Evans Date of Birth [id number]  Medical Record number [id number]  Date of Service: 09/28/2023  Chief Complaint  Patient presents with   Acute Visit    Patient c/o cough with green mucus production, postnasal drainage, hoarseness, in his voice, and sinus pressure. He did have a sore throat during initial onset of symptoms which has since improved. Symptoms began 5-6 days ago. He has been taking loratadine, nasal spray, and Mucus relief PR daily since Monday.      HPI Pt is here for a sick visit. He reports about 4 days ago he started having swollen tonsils/lymph nodes.  He describes the throat as scratchy. He is now having congestion, cough, ear pressure, lost voice.  Denies fever, chills.  He has been taking Claritin, a nasal spray, and a mucous relief.    Current Medication:  Outpatient Encounter Medications as of 09/28/2023  Medication Sig   amoxicillin -clavulanate (AUGMENTIN ) 875-125 MG tablet Take 1 tablet by mouth 2 (two) times daily.   Multiple Vitamin (MULTIVITAMIN) capsule Take 1 capsule by mouth daily.   No facility-administered encounter medications on file as of 09/28/2023.      Medical History: Past Medical History:  Diagnosis Date   Allergy    Seasonal Allergies   Cancer (HCC) 2011   Skin Melanoma, established with dermatologist   Cataract Surgery late 2021   Left eye   Healthcare maintenance 01/24/2023   Heart murmur 1990   Childhood. Resolved. Did establish with cardiology, cleared.   Internal hemorrhoid    Wears contact lenses      Vital Signs: BP 110/80   Pulse 63   Temp (!) 97.4 F (36.3 C)   Ht 5' 8 (1.727 m)   Wt 150 lb (68 kg)   SpO2 96%   BMI 22.81 kg/m    Review of Systems  Constitutional:  Negative for chills, fatigue and fever.  HENT:  Positive for congestion, ear pain, sore throat and voice change.   Respiratory:  Positive for  cough.   Hematological:  Positive for adenopathy.    Physical Exam Vitals reviewed.  Constitutional:      Appearance: Normal appearance.  HENT:     Head: Normocephalic.     Right Ear: Tympanic membrane normal.     Left Ear: Tympanic membrane normal.     Nose:     Right Sinus: Frontal sinus tenderness present. No maxillary sinus tenderness.     Left Sinus: Frontal sinus tenderness present. No maxillary sinus tenderness.     Mouth/Throat:     Mouth: Mucous membranes are moist.     Pharynx: No oropharyngeal exudate or posterior oropharyngeal erythema.  Eyes:     General:        Right eye: No discharge.        Left eye: No discharge.     Extraocular Movements: Extraocular movements intact.     Pupils: Pupils are equal, round, and reactive to light.  Cardiovascular:     Rate and Rhythm: Normal rate and regular rhythm.     Pulses: Normal pulses.     Heart sounds: Normal heart sounds. No murmur heard. Pulmonary:     Effort: Pulmonary effort is normal. No respiratory distress.     Breath sounds: Normal breath sounds. No wheezing or rhonchi.  Abdominal:     General: Abdomen is flat. Bowel sounds are normal. There is  no distension.     Palpations: There is no mass.     Tenderness: There is no abdominal tenderness. There is no guarding.     Hernia: No hernia is present.  Musculoskeletal:        General: No swelling or deformity. Normal range of motion.     Cervical back: Normal range of motion.  Skin:    General: Skin is warm and dry.     Capillary Refill: Capillary refill takes less than 2 seconds.  Neurological:     General: No focal deficit present.     Mental Status: He is alert.     Cranial Nerves: No cranial nerve deficit.     Gait: Gait normal.  Psychiatric:        Mood and Affect: Mood normal.        Behavior: Behavior normal.        Judgment: Judgment normal.    Assessment/Plan: 1. Acute non-recurrent frontal sinusitis (Primary) Patient Instructions: -Take  complete course of antibiotics as prescribed.  Take with food.  -Try Flonase /Fluticasone  nasal spray, 2 sprays to each nostril once a day. -You can try using a neti pot or nasal saline rinse product to help clear mucus congestion. -Rest and stay well hydrated (by drinking water  and other liquids). Avoid/limit caffeine. -Take over-the-counter medicines (i.e. Mucinex, decongestant, Ibuprofen  or Tylenol , cough suppressant) to help relieve your symptoms. -For your cough, use cough drops/throat lozenges, gargle warm salt water  and/or drink warm liquids (like tea with honey). -Send my chart message to provider or schedule return visit as needed for new/worsening symptoms or if symptoms do not improve as discussed with antibiotic and other recommended treatment.   - amoxicillin -clavulanate (AUGMENTIN ) 875-125 MG tablet; Take 1 tablet by mouth 2 (two) times daily.  Dispense: 20 tablet; Refill: 0     General Counseling: perrion diesel understanding of the findings of todays visit and agrees with plan of treatment. I have discussed any further diagnostic evaluation that may be needed or ordered today. We also reviewed his medications today. he has been encouraged to call the office with any questions or concerns that should arise related to todays visit.   No orders of the defined types were placed in this encounter.   Meds ordered this encounter  Medications   amoxicillin -clavulanate (AUGMENTIN ) 875-125 MG tablet    Sig: Take 1 tablet by mouth 2 (two) times daily.    Dispense:  20 tablet    Refill:  0    Time spent:15 Minutes    Juliene DOROTHA Howells AGNP-C Nurse Practitioner

## 2023-11-01 DIAGNOSIS — Z08 Encounter for follow-up examination after completed treatment for malignant neoplasm: Secondary | ICD-10-CM | POA: Diagnosis not present

## 2023-11-01 DIAGNOSIS — L821 Other seborrheic keratosis: Secondary | ICD-10-CM | POA: Diagnosis not present

## 2023-11-01 DIAGNOSIS — Z85820 Personal history of malignant melanoma of skin: Secondary | ICD-10-CM | POA: Diagnosis not present

## 2023-11-01 DIAGNOSIS — D225 Melanocytic nevi of trunk: Secondary | ICD-10-CM | POA: Diagnosis not present

## 2023-11-14 DIAGNOSIS — R220 Localized swelling, mass and lump, head: Secondary | ICD-10-CM | POA: Diagnosis not present

## 2023-11-14 DIAGNOSIS — J3489 Other specified disorders of nose and nasal sinuses: Secondary | ICD-10-CM | POA: Diagnosis not present

## 2023-11-15 ENCOUNTER — Other Ambulatory Visit: Payer: Self-pay | Admitting: Otolaryngology

## 2023-11-15 DIAGNOSIS — R220 Localized swelling, mass and lump, head: Secondary | ICD-10-CM

## 2023-11-16 ENCOUNTER — Ambulatory Visit
Admission: RE | Admit: 2023-11-16 | Discharge: 2023-11-16 | Disposition: A | Discharge status: Home / Self Care | Payer: BC Managed Care – PPO | Source: Ambulatory Visit | Attending: Otolaryngology | Admitting: Otolaryngology

## 2023-11-16 DIAGNOSIS — R220 Localized swelling, mass and lump, head: Secondary | ICD-10-CM | POA: Diagnosis not present

## 2023-11-16 DIAGNOSIS — R490 Dysphonia: Secondary | ICD-10-CM | POA: Diagnosis not present

## 2023-11-16 MED ORDER — IOPAMIDOL (ISOVUE-370) INJECTION 76%
60.0000 mL | Freq: Once | INTRAVENOUS | Status: AC | PRN
  Administered 2023-11-16: 60 mL via INTRAVENOUS

## 2023-12-22 ENCOUNTER — Other Ambulatory Visit: Payer: Self-pay

## 2023-12-22 ENCOUNTER — Ambulatory Visit (INDEPENDENT_AMBULATORY_CARE_PROVIDER_SITE_OTHER): Payer: Self-pay | Admitting: Adult Health

## 2023-12-22 ENCOUNTER — Encounter: Payer: Self-pay | Admitting: Adult Health

## 2023-12-22 VITALS — BP 123/64 | HR 60 | Temp 98.1°F | Ht 68.0 in

## 2023-12-22 DIAGNOSIS — J029 Acute pharyngitis, unspecified: Secondary | ICD-10-CM

## 2023-12-22 DIAGNOSIS — J301 Allergic rhinitis due to pollen: Secondary | ICD-10-CM

## 2023-12-22 LAB — POCT RAPID STREP A (OFFICE): Rapid Strep A Screen: NEGATIVE

## 2023-12-22 NOTE — Progress Notes (Signed)
 [facility] [address] [place], [place] [address]   Office Visit Note  Patient Name: Randall Evans Date of Birth [id number]  Medical Record number [id number]  Date of Service: 12/22/2023  Chief Complaint  Patient presents with   Sore Throat    Patient c/o sore throat x 2 days. Denies fever. He has been taking Zyrtec and using an OTC nasal spray to treat allergy symptoms. He took a COVID test at home yesterday which was negative.     HPI Pt is here for a sick visit. He reports sore throat that started 3 days ago.  It has become worse over the last 2 days.  He also reports some headache, Denies fever, chills, cough, nausea, vomiting or diarrhea.    Current Medication:  Outpatient Encounter Medications as of 12/22/2023  Medication Sig   cetirizine (ZYRTEC) 10 MG tablet Take 10 mg by mouth daily.   fluticasone (FLONASE) 50 MCG/ACT nasal spray Place into both nostrils daily.   Multiple Vitamin (MULTIVITAMIN) capsule Take 1 capsule by mouth daily.   [DISCONTINUED] amoxicillin-clavulanate (AUGMENTIN) 875-125 MG tablet Take 1 tablet by mouth 2 (two) times daily.   No facility-administered encounter medications on file as of 12/22/2023.      Medical History: Past Medical History:  Diagnosis Date   Allergy    Seasonal Allergies   Cancer (HCC) 2011   Skin Melanoma, established with dermatologist   Cataract Surgery late 2021   Left eye   Healthcare maintenance 01/24/2023   Heart murmur 1990   Childhood. Resolved. Did establish with cardiology, cleared.   Internal hemorrhoid    Wears contact lenses      Vital Signs: BP 123/64   Pulse 60   Temp 98.1 F (36.7 C)   Ht 5\' 8"  (1.727 m)   SpO2 99%   BMI 22.81 kg/m    Review of Systems  Constitutional:  Negative for chills, fatigue and fever.  HENT:  Negative for congestion.   Eyes:  Negative for pain and itching.  Respiratory:  Negative for cough.   Cardiovascular:  Negative for chest pain.  Gastrointestinal:   Negative for diarrhea, nausea and vomiting.    Physical Exam Vitals reviewed.  Constitutional:      Appearance: He is well-developed.  HENT:     Head: Normocephalic.     Mouth/Throat:     Mouth: Mucous membranes are moist.     Tonsils: 0 on the right. 0 on the left.  Neurological:     Mental Status: He is alert.    Results for orders placed or performed in visit on 12/22/23 (from the past 24 hours)  POCT rapid strep A     Status: None   Collection Time: 12/22/23  9:25 AM  Result Value Ref Range   Rapid Strep A Screen Negative Negative    Assessment/Plan: 1. Seasonal allergic rhinitis due to pollen (Primary) Discussed using Daily allergy medication such as Zyrtec or Claritin. Also instructed patient to use Flonase, Two sprays in each nostril twice daily. Use sudafed as discussed.   2. Sore throat - POCT rapid strep A     General Counseling: Randall Evans verbalizes understanding of the findings of todays visit and agrees with plan of treatment. I have discussed any further diagnostic evaluation that may be needed or ordered today. We also reviewed his medications today. he has been encouraged to call the office with any questions or concerns that should arise related to todays visit.   Orders  Placed This Encounter  Procedures   POCT rapid strep A    No orders of the defined types were placed in this encounter.   Time spent:15 Minutes    Johnna Acosta AGNP-C Nurse Practitioner

## 2024-01-30 ENCOUNTER — Encounter: Payer: Self-pay | Admitting: Family Medicine

## 2024-02-08 ENCOUNTER — Encounter: Payer: Self-pay | Admitting: Family Medicine

## 2024-02-08 ENCOUNTER — Ambulatory Visit: Admitting: Family Medicine

## 2024-02-08 VITALS — BP 120/70 | HR 51 | Ht 68.0 in | Wt 151.0 lb

## 2024-02-08 DIAGNOSIS — J309 Allergic rhinitis, unspecified: Secondary | ICD-10-CM

## 2024-02-08 DIAGNOSIS — Z0000 Encounter for general adult medical examination without abnormal findings: Secondary | ICD-10-CM

## 2024-02-08 DIAGNOSIS — E559 Vitamin D deficiency, unspecified: Secondary | ICD-10-CM

## 2024-02-08 DIAGNOSIS — E785 Hyperlipidemia, unspecified: Secondary | ICD-10-CM | POA: Diagnosis not present

## 2024-02-13 ENCOUNTER — Encounter: Payer: Self-pay | Admitting: Family Medicine

## 2024-02-13 DIAGNOSIS — E559 Vitamin D deficiency, unspecified: Secondary | ICD-10-CM | POA: Insufficient documentation

## 2024-02-13 DIAGNOSIS — E785 Hyperlipidemia, unspecified: Secondary | ICD-10-CM

## 2024-02-13 HISTORY — DX: Hyperlipidemia, unspecified: E78.5

## 2024-02-13 NOTE — Assessment & Plan Note (Signed)
 Elevated LDL cholesterol Slightly elevated LDL, possibly genetic. No immediate medication needed unless ASCVD score indicates high risk.

## 2024-02-13 NOTE — Patient Instructions (Signed)
-   Obtain fasting labs with orders provided (can have water  or black coffee but otherwise no food or drink x 8 hours before labs) - Review information provided - Attend eye doctor annually, dentist every 6 months, work towards or maintain 30 minutes of moderate intensity physical activity at least 5 days per week, and consume a balanced diet - Return in 1 year for physical - Contact us  for any questions between now and then   Patient Action Plan  1. Allergic Rhinitis and Sinus Flare-Ups:    - Continue using Flonase  and Claritin as needed for allergy management.    - Schedule a follow-up with an ENT specialist for a referral to [facility] to address recurrent sinus flare-ups.  2. Healthcare Maintenance:    - Complete ordered lab tests as part of your annual examination. Follow up on results with your healthcare provider.  3. Hyperlipidemia:    - Monitor LDL cholesterol levels. No medication is needed at this time unless further assessment indicates high risk.  4. Vitamin D  Deficiency:    - Check your vitamin D  levels with a blood test.    - If levels are still low, take over-the-counter vitamin D  supplements as recommended.  Red Flags: Contact your healthcare provider if you experience severe or persistent symptoms related to allergies or sinus issues that do not improve with medication.

## 2024-02-13 NOTE — Assessment & Plan Note (Signed)
 Vitamin D  deficiency Low vitamin D  levels last year. Discussed importance of supplementation and safety of over-the-counter vitamin D . - Check vitamin D  levels. - Recommend over-the-counter vitamin D  if levels remain low.

## 2024-02-13 NOTE — Assessment & Plan Note (Signed)
 Allergic rhinitis Managed with Flonase  and Claritin. No significant recent issues.  Recurrent sinus flare-ups Annual sinus flare-ups with pain and swelling. Previous CT showed post-traumatic maxilla changes. Referral to [facility] not completed. - Follow up with ENT for [facility] referral.

## 2024-02-13 NOTE — Progress Notes (Signed)
 Annual Physical Exam Visit  Patient Information:  Patient ID: BRAETON WOLGAMOTT, male DOB: 1976-01-17 Age: 48 y.o. MRN: [id number]   Subjective:   CC: Annual Physical Exam  HPI:  Randall Evans is here for their annual physical.  I reviewed the past medical history, family history, social history, surgical history, and allergies today and changes were made as necessary.  Please see the problem list section below for additional details.  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 Resolved. Did establish with cardiology, cleared.   Hyperlipidemia 02/13/2024   Internal hemorrhoid    Wears contact lenses    Past Surgical History: Past Surgical History:  Procedure Laterality Date   COLONOSCOPY WITH PROPOFOL  N/A 07/03/2018   Procedure: COLONOSCOPY WITH PROPOFOL ;  Surgeon: Marnee Sink, MD;  Location: [facility];  Service: Endoscopy;  Laterality: N/A;   EYE SURGERY  2021   Cataract   FLEXIBLE SIGMOIDOSCOPY N/A 11/03/2020   Procedure: FLEXIBLE SIGMOIDOSCOPY;  Surgeon: Selena Daily, MD;  Location: [facility];  Service: Gastroenterology;  Laterality: N/A;   MANDIBLE SURGERY  1995   corrective with hardware   MELANOMA EXCISION  2011   chest, followed by sentinel node evaluation and excision   VASECTOMY  2022   Family History: Family History  Problem Relation Age of Onset   Asthma Mother    Hypertension Mother    Osteoarthritis Mother    Depression Father    Diabetes Father        prediabetes   Stroke Maternal Grandmother    Cancer Maternal Grandmother        breast   Hypertension Maternal Grandmother    Diabetes Paternal Grandmother    Depression Paternal Grandmother    Heart disease Paternal Grandmother    Diabetes  Paternal Grandfather    Heart disease Paternal Grandfather    Stroke Paternal Grandfather    Hypertension Paternal Grandfather    Allergies: No Known Allergies Health Maintenance: Health Maintenance  Topic Date Due   COVID-19 Vaccine (3 - Pfizer risk series) 02/24/2024 (Originally 01/16/2020)   INFLUENZA VACCINE  04/20/2024   Colonoscopy  07/03/2028   DTaP/Tdap/Td (2 - Tdap) 02/05/2030   Hepatitis C Screening  Completed   HIV Screening  Completed   HPV VACCINES  Aged Out   Meningococcal B Vaccine  Aged Out    HM Colonoscopy          Upcoming     Colonoscopy (Every 10 Years) Next due on 07/03/2028    07/03/2018  COLONOSCOPY   Only the first 1 history entries have been loaded, but more history exists.               Medications: Current Outpatient Medications on File Prior to Visit  Medication Sig Dispense Refill   cetirizine (ZYRTEC) 10 MG tablet Take 10 mg by mouth daily.     fluticasone  (FLONASE ) 50 MCG/ACT nasal spray Place into both nostrils daily.     Multiple Vitamin (MULTIVITAMIN) capsule Take 1 capsule by mouth daily.     No current facility-administered medications on file prior to visit.    Objective:   Vitals:   02/08/24 0837  BP: 120/70  Pulse: (!) 51  SpO2: 99%  Vitals:   02/08/24 0837  Weight: 151 lb (68.5 kg)  Height: 5\' 8"  (1.727 m)   Body mass index is 22.96 kg/m.  General: Well Developed, well nourished, and in no acute distress.  Neuro: Alert and oriented x3, extra-ocular muscles intact, sensation grossly intact. Cranial nerves II through XII are grossly intact, motor, sensory, and coordinative functions are intact. HEENT: Normocephalic, atraumatic, neck supple, no masses, no lymphadenopathy, thyroid nonenlarged. Oropharynx, nasopharynx, external ear canals are unremarkable. Skin: Warm and dry, no rashes noted.  Cardiac: Regular rate and rhythm, no murmurs rubs or gallops. No peripheral edema. Pulses symmetric. Respiratory: Clear to  auscultation bilaterally. Speaking in full sentences.  Abdominal: Soft, nontender, nondistended, positive bowel sounds, no masses, no organomegaly. Musculoskeletal: Stable, and with full range of motion.  Impression and Recommendations:   The patient was counselled, risk factors were discussed, and anticipatory guidance given.  Problem List Items Addressed This Visit     Allergic rhinitis due to allergen (Chronic)       Allergic rhinitis Managed with Flonase  and Claritin. No significant recent issues.  Recurrent sinus flare-ups Annual sinus flare-ups with pain and swelling. Previous CT showed post-traumatic maxilla changes. Referral to [facility] not completed. - Follow up with ENT for [facility] referral.     Healthcare maintenance - Primary   Annual examination completed, risk stratification labs ordered, anticipatory guidance provided.  We will follow labs once resulted.      Hyperlipidemia   Elevated LDL cholesterol Slightly elevated LDL, possibly genetic. No immediate medication needed unless ASCVD score indicates high risk.       Vitamin D  deficiency   Vitamin D  deficiency Low vitamin D  levels last year. Discussed importance of supplementation and safety of over-the-counter vitamin D . - Check vitamin D  levels. - Recommend over-the-counter vitamin D  if levels remain low.        Orders & Medications Medications: No orders of the defined types were placed in this encounter.  No orders of the defined types were placed in this encounter.    No follow-ups on file.    Ma Saupe, MD, [facility]   Primary Care Sports Medicine Primary Care and Sports Medicine at [facility]

## 2024-02-13 NOTE — Assessment & Plan Note (Signed)
 Annual examination completed, risk stratification labs ordered, anticipatory guidance provided.  We will follow labs once resulted.

## 2024-04-16 ENCOUNTER — Ambulatory Visit (INDEPENDENT_AMBULATORY_CARE_PROVIDER_SITE_OTHER): Payer: Self-pay | Admitting: Medical

## 2024-04-16 ENCOUNTER — Encounter: Payer: Self-pay | Admitting: Medical

## 2024-04-16 VITALS — BP 121/71 | HR 61 | Temp 97.1°F

## 2024-04-16 DIAGNOSIS — H6593 Unspecified nonsuppurative otitis media, bilateral: Secondary | ICD-10-CM

## 2024-04-16 DIAGNOSIS — J069 Acute upper respiratory infection, unspecified: Secondary | ICD-10-CM

## 2024-04-16 NOTE — Progress Notes (Unsigned)
 Therapist, [facility] [address] [place], [place] [address]   Office Visit Note  Patient Name: Randall Evans Date of Birth [id number]  Medical Record number [id number]  Date of Service: 04/16/2024  Chief Complaint  Patient presents with   Acute Visit     HPI 48 y.o. male presents with respiratory symptoms.   Has 5 YO daughter, had cold about 2 weeks ago.  He developed sx a week ago, initially sore throat. Developed clear and now yellow nasal discharge, head pressure. Still has nasal/head congestion, no longer pressure/pain. Ears pop some when swallows or yawns. Coughing some, sometimes productive of yellow mucus. Had HA last week, not currently. Cough not frequent.   Denies hx of ear/sinus infection, has had occasionally.   Used Afrin for 3 days. Now using Sudafed and nasal saline.    Current Medication:  Outpatient Encounter Medications as of 04/16/2024  Medication Sig   cetirizine (ZYRTEC) 10 MG tablet Take 10 mg by mouth daily.   fluticasone  (FLONASE ) 50 MCG/ACT nasal spray Place into both nostrils daily.   Multiple Vitamin (MULTIVITAMIN) capsule Take 1 capsule by mouth daily.   No facility-administered encounter medications on file as of 04/16/2024.      Medical History: Past Medical History:  Diagnosis Date   Allergy    Seasonal Allergies   Cancer (HCC) 2011   Skin Melanoma, established with dermatologist   Cataract Surgery late 2021   Left eye   Healthcare maintenance 01/24/2023   Heart murmur 1990   Childhood. Resolved. Did establish with cardiology, cleared.   Hyperlipidemia 02/13/2024   Internal hemorrhoid    Wears contact lenses      Vital Signs: BP 121/71   Pulse 61   Temp (!) 97.1 F (36.2 C) (Tympanic)   SpO2 98%    Review of Systems  Physical Exam    Assessment/Plan:   General Counseling: Santana verbalizes understanding of the findings of todays visit and agrees with plan of treatment. I have discussed any further diagnostic  evaluation that may be needed or ordered today. We also reviewed his medications today. he has been encouraged to call the office with any questions or concerns that should arise related to todays visit.   No orders of the defined types were placed in this encounter.   No orders of the defined types were placed in this encounter.   Time spent:*** Minutes    Joen Arts PA-C

## 2024-04-17 NOTE — Patient Instructions (Addendum)
-  Rest and stay well hydrated (by drinking water  and other liquids). Avoid/limit caffeine. -Take over-the-counter medicines (i.e. Sudafed, Ibuprofen  or Tylenol , Robitussin) to help relieve your symptoms as needed. -Continue nasal saline rinses or neti pot once or twice daily as needed. -Add Flonase /Fluticasone  nasal spray, 2 sprays to each nostril once a day, for at least next 7 days and longer if needed for persistent congestion/ear pressure. -For your cough, use cough drops/throat lozenges and/or drink warm liquids (like tea with honey). -Call clinic or schedule return visit as needed for new/worsening symptoms (i.e. fever, increased/persistent ear or sinus pain) or if symptoms do not improve as discussed with recommended treatment over next 5-7 days.

## 2024-07-22 DIAGNOSIS — Z23 Encounter for immunization: Secondary | ICD-10-CM | POA: Diagnosis not present

## 2024-08-02 ENCOUNTER — Encounter: Payer: Self-pay | Admitting: Family Medicine

## 2024-08-02 ENCOUNTER — Other Ambulatory Visit: Payer: Self-pay

## 2024-08-02 DIAGNOSIS — Z0000 Encounter for general adult medical examination without abnormal findings: Secondary | ICD-10-CM

## 2024-08-02 NOTE — Telephone Encounter (Signed)
 Please review and advise.  JM

## 2024-08-06 ENCOUNTER — Telehealth: Payer: Self-pay

## 2024-08-06 NOTE — Telephone Encounter (Signed)
 Copied from CRM [id number]. Topic: Clinical - Request for Lab/Test Order >> Aug 06, 2024  3:30 PM Wess RAMAN wrote: Reason for CRM: Patient is requesting his lab order be sent to [facility]  Fax #: Patient will call back to provide  Patient Callback #: [phone]

## 2024-08-07 ENCOUNTER — Other Ambulatory Visit: Payer: Self-pay

## 2024-08-07 DIAGNOSIS — Z0000 Encounter for general adult medical examination without abnormal findings: Secondary | ICD-10-CM

## 2024-08-07 NOTE — Telephone Encounter (Signed)
 Send patient RHONA message to get fax number.  JM

## 2024-08-09 ENCOUNTER — Other Ambulatory Visit

## 2024-08-09 DIAGNOSIS — Z0000 Encounter for general adult medical examination without abnormal findings: Secondary | ICD-10-CM

## 2024-08-09 NOTE — Addendum Note (Signed)
 Addended by: Delaina Fetsch on: 08/09/2024 07:10 AM   Modules accepted: Orders

## 2024-08-10 LAB — COMPREHENSIVE METABOLIC PANEL WITH GFR
ALT: 27 IU/L (ref 0–44)
AST: 25 IU/L (ref 0–40)
Albumin: 4.6 g/dL (ref 4.1–5.1)
Alkaline Phosphatase: 52 IU/L (ref 47–123)
BUN/Creatinine Ratio: 19 (ref 9–20)
BUN: 18 mg/dL (ref 6–24)
Bilirubin Total: 1 mg/dL (ref 0.0–1.2)
CO2: 25 mmol/L (ref 20–29)
Calcium: 9.4 mg/dL (ref 8.7–10.2)
Chloride: 100 mmol/L (ref 96–106)
Creatinine, Ser: 0.95 mg/dL (ref 0.76–1.27)
Globulin, Total: 2.5 g/dL (ref 1.5–4.5)
Glucose: 75 mg/dL (ref 70–99)
Potassium: 3.9 mmol/L (ref 3.5–5.2)
Sodium: 141 mmol/L (ref 134–144)
Total Protein: 7.1 g/dL (ref 6.0–8.5)
eGFR: 100 mL/min/1.73 (ref >59)

## 2024-08-10 LAB — CBC
Hematocrit: 44.4 % (ref 37.5–51.0)
Hemoglobin: 15.1 g/dL (ref 13.0–17.7)
MCH: 31.3 pg (ref 26.6–33.0)
MCHC: 34 g/dL (ref 31.5–35.7)
MCV: 92 fL (ref 79–97)
Platelets: 226 x10E3/uL (ref 150–450)
RBC: 4.82 x10E6/uL (ref 4.14–5.80)
RDW: 12.3 % (ref 11.6–15.4)
WBC: 4.3 x10E3/uL (ref 3.4–10.8)

## 2024-08-10 LAB — LIPID PANEL
Chol/HDL Ratio: 3.3 ratio (ref 0.0–5.0)
Cholesterol, Total: 214 mg/dL — ABNORMAL HIGH (ref 100–199)
HDL: 65 mg/dL (ref >39)
LDL Chol Calc (NIH): 141 mg/dL — ABNORMAL HIGH (ref 0–99)
Triglycerides: 48 mg/dL (ref 0–149)
VLDL Cholesterol Cal: 8 mg/dL (ref 5–40)

## 2024-08-10 LAB — HEMOGLOBIN A1C
Est. average glucose Bld gHb Est-mCnc: 91 mg/dL
Hgb A1c MFr Bld: 4.8 % (ref 4.8–5.6)

## 2024-08-29 ENCOUNTER — Ambulatory Visit: Payer: Self-pay | Admitting: Family Medicine

## 2024-08-29 NOTE — Telephone Encounter (Signed)
 Please review and advise patient.   JM

## 2025-02-13 ENCOUNTER — Encounter: Admitting: Family Medicine
